# Patient Record
Sex: Male | Born: 1955
Health system: Southern US, Community
[De-identification: ages and names within clinical notes are randomized; demographics above are authoritative.]

## PROBLEM LIST (undated history)

## (undated) DIAGNOSIS — E785 Hyperlipidemia, unspecified: Secondary | ICD-10-CM

## (undated) DIAGNOSIS — B1910 Unspecified viral hepatitis B without hepatic coma: Secondary | ICD-10-CM

## (undated) DIAGNOSIS — Z5189 Encounter for other specified aftercare: Secondary | ICD-10-CM

## (undated) DIAGNOSIS — B1920 Unspecified viral hepatitis C without hepatic coma: Secondary | ICD-10-CM

## (undated) DIAGNOSIS — R011 Cardiac murmur, unspecified: Secondary | ICD-10-CM

## (undated) DIAGNOSIS — Z8619 Personal history of other infectious and parasitic diseases: Secondary | ICD-10-CM

## (undated) DIAGNOSIS — M1990 Unspecified osteoarthritis, unspecified site: Secondary | ICD-10-CM

## (undated) HISTORY — DX: Hyperlipidemia, unspecified: E78.5

## (undated) HISTORY — DX: Unspecified osteoarthritis, unspecified site: M19.90

## (undated) HISTORY — DX: Encounter for other specified aftercare: Z51.89

## (undated) HISTORY — DX: Unspecified viral hepatitis B without hepatic coma: B19.10

## (undated) HISTORY — DX: Cardiac murmur, unspecified: R01.1

## (undated) HISTORY — DX: Personal history of other infectious and parasitic diseases: Z86.19

## (undated) HISTORY — DX: Unspecified viral hepatitis C without hepatic coma: B19.20

## (undated) HISTORY — PX: EYE SURGERY: SHX253

---

## 1979-06-19 HISTORY — PX: KIDNEY SURGERY: SHX687

## 1997-06-18 HISTORY — PX: SHOULDER SURGERY: SHX246

## 2000-06-18 HISTORY — PX: OTHER SURGICAL HISTORY: SHX169

## 2010-06-18 HISTORY — PX: JOINT REPLACEMENT: SHX530

## 2015-03-18 LAB — PSA: PSA: 0.8

## 2017-04-18 LAB — CBC AND DIFFERENTIAL
HCT: 48 (ref 41–53)
Hemoglobin: 14.7 (ref 13.5–17.5)
Neutrophils Absolute: 3
Platelets: 324 (ref 150–399)
WBC: 5.3

## 2017-04-18 LAB — LIPID PANEL
Cholesterol: 268 — AB (ref 0–200)
HDL: 47 (ref 35–70)
LDL Cholesterol: 187
Triglycerides: 171 — AB (ref 40–160)

## 2017-04-18 LAB — HEPATIC FUNCTION PANEL
ALT: 21 (ref 10–40)
AST: 28 (ref 14–40)
Alkaline Phosphatase: 56 (ref 25–125)
Bilirubin, Total: 0.6

## 2017-04-18 LAB — BASIC METABOLIC PANEL
BUN: 12 (ref 4–21)
Glucose: 94
Potassium: 4.5 (ref 3.4–5.3)
Sodium: 140 (ref 137–147)

## 2017-04-18 LAB — CBC: RBC: 5.48 — AB (ref 3.87–5.11)

## 2017-05-16 LAB — HM COLONOSCOPY

## 2019-05-25 ENCOUNTER — Encounter: Payer: Self-pay | Admitting: Physician Assistant

## 2019-05-25 ENCOUNTER — Ambulatory Visit (INDEPENDENT_AMBULATORY_CARE_PROVIDER_SITE_OTHER): Payer: Commercial Managed Care - PPO | Admitting: Physician Assistant

## 2019-05-25 ENCOUNTER — Other Ambulatory Visit: Payer: Self-pay

## 2019-05-25 VITALS — BP 140/86 | HR 56 | Temp 97.5°F | Ht 70.0 in | Wt 181.5 lb

## 2019-05-25 DIAGNOSIS — M5431 Sciatica, right side: Secondary | ICD-10-CM | POA: Diagnosis not present

## 2019-05-25 DIAGNOSIS — R2232 Localized swelling, mass and lump, left upper limb: Secondary | ICD-10-CM | POA: Diagnosis not present

## 2019-05-25 DIAGNOSIS — M25512 Pain in left shoulder: Secondary | ICD-10-CM

## 2019-05-25 DIAGNOSIS — M79644 Pain in right finger(s): Secondary | ICD-10-CM

## 2019-05-25 DIAGNOSIS — G8929 Other chronic pain: Secondary | ICD-10-CM

## 2019-05-25 MED ORDER — PREDNISONE 5 MG PO TABS: ORAL_TABLET | ORAL | 0 refills | Status: DC

## 2019-05-25 NOTE — Patient Instructions (Signed)
It was great to see you!  Please make and appointment with Dr. Georgina Snell on your way out.  Let's try the prednisone to help with inflammation.  Let's follow-up in 3 months for a physical, sooner if you have concerns.  Take care,  [name] PA-C

## 2019-05-25 NOTE — Progress Notes (Signed)
Nathaniel Stevens is a 63 y.o. male here to Establish care.  I acted as a Education administrator for [employer], PA-C Anselmo Pickler, LPN  History of Present Illness:   Chief Complaint  Patient presents with  . Sciatica    Right  . Trigger thumb    Right  . Shoulder Pain    Left   Sciatica Pt c/o gluteal pain radiating down entire Rt leg off and on x 6 weeks. Also having numbness in toes of right foot. Much worse first thing in the morning.  Has looked up exercises and done these at home and it has helped a bit, was 10/10 but is now more bearable. No recent back injury, but did move [place] multiple [place] 6 months ago and did lots of heavy lifting with his move. No issues with bowel or bladder incontinence. Takes occasional motrin -- 1 to 2 times.   Shoulder pain Active swimmer in the past, prior to pandemic. Changed to free weight program after pandemic, about 6 months ago.[name] He noticed that his pain was worse with using the free weights, so he actually recently purchased a total body gym and feels like this has significant helped with his symptoms. He does report a history of frozen shoulder, in 2018, and feels like this may be the same thing? Denies hx DM.  Bony lesion Growth calcium deposit to L anterior shoulder. Imaged 3 years ago and was told it was a calcification. Has gotten slightly bigger and wants to know if there is anything specific that can be done about it.  Trigger thumb Pt c/o pain right thumb pain  x 18 months, has been doing thumb splinting and Motrin no relief. R- handed. Pain was 6/10. He started only splinting at night and also immobilizing his thumb joint and found that to be helpful for his symptoms. Denies weakness or known injury. Denies using fine instruments with his hands on a daily basis.  Weight -- Weight: 181 lb 8 oz (82.3 kg)    Depression screen [facility] 2/9 05/25/2019  Decreased Interest 0  Down, Depressed, Hopeless 0  PHQ - 2 Score 0    No flowsheet data  found.   Other providers/specialists: Patient Care Team: [name], [place] as PCP - General (Physician Assistant)   Past Medical History:  Diagnosis Date  . Hepatitis B    due to transfusion  . Hepatitis C    due to transfusion  . History of chicken pox      Social History   Socioeconomic History  . Marital status: Married    Spouse name: Not on file  . Number of children: Not on file  . Years of education: Not on file  . Highest education level: Not on file  Occupational History  . Not on file  Social Needs  . Financial resource strain: Not on file  . Food insecurity    Worry: Not on file    Inability: Not on file  . Transportation needs    Medical: Not on file    Non-medical: Not on file  Tobacco Use  . Smoking status: Former Smoker    Types: Cigarettes  . Smokeless tobacco: Never Used  . Tobacco comment: as a teen, has not smoked in 55 yrs  Substance and Sexual Activity  . Alcohol use: Yes    Comment: occasionally  . Drug use: Never  . Sexual activity: Yes  Lifestyle  . Physical activity    Days per week: Not on file  Minutes per session: Not on file  . Stress: Not on file  Relationships  . Social Herbalist on phone: Not on file    Gets together: Not on file    Attends religious service: Not on file    Active member of club or organization: Not on file    Attends meetings of clubs or organizations: Not on file    Relationship status: Not on file  . Intimate partner violence    Fear of current or ex partner: Not on file    Emotionally abused: Not on file    Physically abused: Not on file    Forced sexual activity: Not on file  Other Topics Concern  . Not on file  Social History Narrative  . Not on file    History reviewed. No pertinent surgical history.  Family History  Problem Relation Age of Onset  . Hypertension Father   . Hyperlipidemia Father   . Heart attack Sister   . Dementia Maternal Grandmother   . Cancer Neg Hx      No Known Allergies   Current Medications:   Current Outpatient Medications:  .  ibuprofen (ADVIL) 200 MG tablet, Take 200 mg by mouth as needed., Disp: , Rfl:  .  Melatonin 5 MG CAPS, Take 1 capsule by mouth at bedtime as needed., Disp: , Rfl:  .  predniSONE (DELTASONE) 5 MG tablet, 6-5-4-3-2-1-off, Disp: 21 tablet, Rfl: 0   Review of Systems:   ROS Negative unless otherwise specified per HPI.  Vitals:   Vitals:   05/25/19 1438  BP: 140/86  Pulse: (!) 56  Temp: (!) 97.5 F (36.4 C)  TempSrc: Temporal  SpO2: 98%  Weight: 181 lb 8 oz (82.3 kg)  Height: 5\' 10"  (1.778 m)      Body mass index is 26.04 kg/m.  Physical Exam:   Physical Exam Vitals signs and nursing note reviewed.  Constitutional:      General: He is not in acute distress.    Appearance: He is well-developed. He is not ill-appearing or toxic-appearing.  HENT:     Head: Normocephalic and atraumatic.  Cardiovascular:     Rate and Rhythm: Regular rhythm. Bradycardia present.     Heart sounds: Normal heart sounds.  Pulmonary:     Effort: Pulmonary effort is normal. No accessory muscle usage or respiratory distress.     Breath sounds: Normal breath sounds.  Musculoskeletal:     Comments: Shoulder exam: Bony fixed nodule to anterior L shoulder No pain with palpation of shoulder landmarks. FROM in abduction and forward flexion. No pain or weakness with testing SITS in ext/int rotation.  Back exam: No decreased ROM 2/2 pain with flexion/extension, lateral side bends, or rotation. Reproducible tenderness with deep palpation to bilateral paraspinal muscles. No bony tenderness. No evidence of erythema, rash or ecchymosis. Negative STLR bilaterally.  Hand exam: Pain with slight tenderness to MCP joint. Slight decreased ROM due to pain of joint.    Skin:    General: Skin is warm and dry.  Neurological:     Mental Status: He is alert.     Comments: Normal perceived sensation to R hand  Psychiatric:         Speech: Speech normal.        Behavior: Behavior is cooperative.     No results found for this or any previous visit.  Assessment and Plan:   Sahal was seen today for sciatica, trigger thumb and shoulder pain.  Diagnoses and all orders for this visit:  Acute pain of left shoulder No red flags on discussion. Trial oral prednisone. Referral to sports medicine. -     Ambulatory referral to Sports Medicine  Right sciatic nerve pain No red flags on discussion. Trial oral prednisone. Referral to sports medicine.  Chronic pain of right thumb Suspect possible early arthritis. Continue night splinting. Trial oral prednisone. Referral to sports medicine.  Nodule of skin of left upper extremity Calcified nodule, will ask sports med to look at with u/s if they are agreeable.  Other orders -     predniSONE (DELTASONE) 5 MG tablet; 6-5-4-3-2-1-off  . Reviewed expectations re: course of current medical issues. . Discussed self-management of symptoms. . Outlined signs and symptoms indicating need for more acute intervention. . Patient verbalized understanding and all questions were answered. . See orders for this visit as documented in the electronic medical record. . Patient received an After-Visit Summary.  CMA or LPN served as scribe during this visit. History, Physical, and Plan performed by medical provider. The above documentation has been reviewed and is accurate and complete.  I spent 45 minutes with this patient, greater than 50% was face-to-face time counseling regarding the above diagnoses.  [name], PA-C

## 2019-05-27 ENCOUNTER — Other Ambulatory Visit: Payer: Self-pay

## 2019-05-28 ENCOUNTER — Ambulatory Visit: Payer: Commercial Managed Care - PPO | Admitting: Family Medicine

## 2019-05-28 ENCOUNTER — Encounter: Payer: Self-pay | Admitting: Physician Assistant

## 2019-05-28 ENCOUNTER — Other Ambulatory Visit: Payer: Commercial Managed Care - PPO

## 2019-05-28 ENCOUNTER — Ambulatory Visit (INDEPENDENT_AMBULATORY_CARE_PROVIDER_SITE_OTHER): Payer: Commercial Managed Care - PPO | Admitting: Family Medicine

## 2019-05-28 ENCOUNTER — Encounter: Payer: Self-pay | Admitting: Family Medicine

## 2019-05-28 ENCOUNTER — Ambulatory Visit (INDEPENDENT_AMBULATORY_CARE_PROVIDER_SITE_OTHER): Payer: Commercial Managed Care - PPO

## 2019-05-28 ENCOUNTER — Other Ambulatory Visit: Payer: Self-pay

## 2019-05-28 VITALS — BP 158/78 | HR 58 | Ht 70.0 in | Wt 181.8 lb

## 2019-05-28 DIAGNOSIS — M65311 Trigger thumb, right thumb: Secondary | ICD-10-CM

## 2019-05-28 DIAGNOSIS — M79644 Pain in right finger(s): Secondary | ICD-10-CM

## 2019-05-28 DIAGNOSIS — M25512 Pain in left shoulder: Secondary | ICD-10-CM

## 2019-05-28 DIAGNOSIS — M5417 Radiculopathy, lumbosacral region: Secondary | ICD-10-CM

## 2019-05-28 NOTE — Progress Notes (Signed)
Subjective:    I'm seeing this patient as a consultation for:  Len Blalock, [place].  Note will be routed back to referring provider.  CC: L shoulder and R thumb and R sciatica  I, Molly Weber, LAT, ATC, am serving as scribe for Dr. Lynne Leader.  HPI: Pt is a 63 y/o male presenting w/ c/o R-sided sciatica, L shoulder and R thumb pain.  R sciatica:  Pt reports R post thigh pain that radiates into the R lower leg w/ numbness and tingling into the R foot (lateral foot, ball of foot and toes 2-5).  He notes that these symptoms started about 2.5-3 months ago.  The only contributing factor that he can note was packing and moving in June 2020. Aggravating factors include not exercising.  He has a history of similar symptoms on the L LE from his 28s which were alleviated from a HEP prescribed by a PT.  He has been doing a HEP most recently that he found on-line.  He has not had any recent lumbar x-rays.   L shoulder:  Pain x 6 months approximately after beginning lifting w/ free weights.  History of adhesive capsulitis in 2018.  Pt has full L shoulder ROM w/ only slight pain at end-range aBd.  R thumb pain: Insidious onset pain x 2 months located at the base of the R thumb.  Rates pain as a 6/10 that will radiate into the R radial thumb w/ thumb aDd.    Has tried hand/thumb splint and Motrin.  He also reports prior triggering in the R thumb which has subsided.  He con't to use his splint at night.    Past medical history, Surgical history, Family history not pertinant except as noted below, Social history, Allergies, and medications have been entered into the medical record, reviewed, and no changes needed.   Review of Systems: No headache, visual changes, nausea, vomiting, diarrhea, constipation, dizziness, abdominal pain, skin rash, fevers, chills, night sweats, weight loss, swollen lymph nodes, body aches, joint swelling, muscle aches, chest pain, shortness of breath, mood changes, visual or auditory  hallucinations.   Objective:    Vitals:   05/28/19 0922  BP: (!) 158/78  Pulse: (!) 58  SpO2: 97%   General: Well Developed, well nourished, and in no acute distress.  Neuro/Psych: Alert and oriented x3, extra-ocular muscles intact, able to move all 4 extremities, sensation grossly intact. Skin: Warm and dry, no rashes noted.  Respiratory: Not using accessory muscles, speaking in full sentences, trachea midline.  Cardiovascular: Pulses palpable, no extremity edema. Abdomen: Does not appear distended. MSK:  Left shoulder: Slightly swollen and AC joint otherwise normal-appearing Mildly tender palpation AC joint Range of motion full abduction external and internal rotation. Strength intact abduction external/internal rotation. Minimally positive Hawkins and Neer's test. Significantly positive crossover arm compression test. Negative empty can test. Negative Yergason's and speeds test.  Pulses cap refill and sensation intact bilateral extremities.  Right hand: Enlarged first MCP.  Mildly enlarged PIP and DIP 2,3, 4, and 5. Normal thumb motion. Mildly tender palpation at palmar thumb at MCP with palpable nodule. No current triggering with thumb motion. Stable ligament exam to thumb at MCP.  L-spine: Nontender to spinal midline. Normal lumbar motion. Lower extremity strength reflexes and sensation are equal and intact bilateral extremities. Positive right-sided slump test. Normal gait.  Lab and Radiology Results X-ray images L-spine, right hand, left shoulder ordered today and will be completed at outside location in the near future.  Impression and Recommendations:    Assessment and Plan: 63 y.o. male with multiple pain complaints  Dominant issue is right-sided lumbosacral radiculopathy at S1 and possibly including L5 nerve root.  Symptoms ongoing for 2-1/2 to 3 months.  Fortunately not experiencing weakness or significant numbness.  Patient is already completing some  conservative management including exercises on his own as well as course of prednisone.  We will continue prednisone course and progress with home exercise program.  Discussed gabapentin however patient is a Human resources officer for [employer] and I am not sure the gabapentin is completely compatible with his job.  He will do some reading on his own and if compatible I will be happy to prescribe. Will obtain x-ray L-spine today likely followed by MRI for epidural steroid injection planning.  Additionally patient has left shoulder pain.  Dominant finding on exam is AC joint swelling.  Patient has a component of AC DJD as well as mild rotator cuff tendinopathy.  Plan to proceed with home exercise program and diclofenac gel trial.  Additionally will obtain x-ray today.  Likely will consider physical therapy in the future if not improving.  Right thumb pain.  Patient has improving trigger thumb however also has changes in his hand consistent with DJD.  X-ray is pending which should help further evaluate this.  We will treat with double Band-Aid splint for trigger thumb as well as topical diclofenac gel.  Recheck near future if needed if not improving. .   Orders Placed This Encounter  Procedures  . DG Lumbar Spine Complete    Standing Status:   Future    Standing Expiration Date:   07/28/2020    Order Specific Question:   Reason for Exam (SYMPTOM  OR DIAGNOSIS REQUIRED)    Answer:   eval right leg radiculopathy L5 S1    Order Specific Question:   Preferred imaging location?    Answer:   [place]    Order Specific Question:   Radiology Contrast Protocol - do NOT remove file path    Answer:   \\[email or web address]s.[email or web address]  . DG Hand Complete Right    Standing Status:   Future    Standing Expiration Date:   07/28/2020    Order Specific Question:   Reason for Exam (SYMPTOM  OR DIAGNOSIS REQUIRED)    Answer:   eval 1st MTP pain and swelling    Order Specific  Question:   Preferred imaging location?    Answer:   [place] [facility]    Order Specific Question:   Radiology Contrast Protocol - do NOT remove file path    Answer:   \\[email or web address]s.pdf  . DG Shoulder Left    Standing Status:   Future    Standing Expiration Date:   07/28/2020    Order Specific Question:   Reason for Exam (SYMPTOM  OR DIAGNOSIS REQUIRED)    Answer:   eval AC DJD and RTC tendonopathy    Order Specific Question:   Preferred imaging location?    Answer:   [place] [facility]    Order Specific Question:   Radiology Contrast Protocol - do NOT remove file path    Answer:   \\[email or web address]\[email or web address]s.[email or web address]   No orders of the defined types were placed in this encounter.   Discussed warning signs or symptoms. Please see discharge instructions. Patient expresses understanding.  The above documentation has been reviewed and is accurate and complete Lynne Leader

## 2019-05-28 NOTE — Patient Instructions (Addendum)
Thank you for coming in today.  Get xrays.  Use the over the counter voltaren gel on the hand and shoulder as needed up to 4x daily for pain.  Use the double bandaid splint on the right thumb for trigger thumb during activity and at bedtime.  Work on shoulder exercises and core exercises.   Anticipate MRI in the near future.   Follow up with me following MRI to review results and discuss next steps.   I will also consider Gabapentin for nerve pain. Research gabapentin with your licence.   We're moving!  Dr. Clovis Riley new office will be located at [address] on the 1st floor.  This location is across the street from the [employer] and in the same complex as the [facility] and [employer]  Our new office phone number will be [phone].  We anticipate beginning to see patients at the [facility] office in early December 2020.  Sciatica  Sciatica is pain, weakness, tingling, or loss of feeling (numbness) along the sciatic nerve. The sciatic nerve starts in the lower back and goes down the back of each leg. Sciatica usually goes away on its own or with treatment. Sometimes, sciatica may come back (recur). What are the causes? This condition happens when the sciatic nerve is pinched or has pressure put on it. This may be the result of:  A disk in between the bones of the spine bulging out too far (herniated disk).  Changes in the spinal disks that occur with aging.  A condition that affects a muscle in the butt.  Extra bone growth near the sciatic nerve.  A break (fracture) of the area between your hip bones (pelvis).  Pregnancy.  Tumor. This is rare. What increases the risk? You are more likely to develop this condition if you:  Play sports that put pressure or stress on the spine.  Have poor strength and ease of movement (flexibility).  Have had a back injury in the past.  Have had back surgery.  Sit for long periods of time.  Do  activities that involve bending or lifting over and over again.  Are very overweight (obese). What are the signs or symptoms? Symptoms can vary from mild to very bad. They may include:  Any of these problems in the lower back, leg, hip, or butt: ? Mild tingling, loss of feeling, or dull aches. ? Burning sensations. ? Sharp pains.  Loss of feeling in the back of the calf or the sole of the foot.  Leg weakness.  Very bad back pain that makes it hard to move. These symptoms may get worse when you cough, sneeze, or laugh. They may also get worse when you sit or stand for long periods of time. How is this treated? This condition often gets better without any treatment. However, treatment may include:  Changing or cutting back on physical activity when you have pain.  Doing exercises and stretching.  Putting ice or heat on the affected area.  Medicines that help: ? To relieve pain and swelling. ? To relax your muscles.  Shots (injections) of medicines that help to relieve pain, irritation, and swelling.  Surgery. Follow these instructions at home: Medicines  Take over-the-counter and prescription medicines only as told by your doctor.  Ask your doctor if the medicine prescribed to you: ? Requires you to avoid driving or using heavy machinery. ? Can cause trouble pooping (constipation). You may need to take these steps to prevent  or treat trouble pooping:  Drink enough fluids to keep your pee (urine) pale yellow.  Take over-the-counter or prescription medicines.  Eat foods that are high in fiber. These include beans, whole grains, and fresh fruits and vegetables.  Limit foods that are high in fat and sugar. These include fried or sweet foods. Managing pain      If told, put ice on the affected area. ? Put ice in a plastic bag. ? Place a towel between your skin and the bag. ? Leave the ice on for 20 minutes, 2-3 times a day.  If told, put heat on the affected area.  Use the heat source that your doctor tells you to use, such as a moist heat pack or a heating pad. ? Place a towel between your skin and the heat source. ? Leave the heat on for 20-30 minutes. ? Remove the heat if your skin turns bright red. This is very important if you are unable to feel pain, heat, or cold. You may have a greater risk of getting burned. Activity   Return to your normal activities as told by your doctor. Ask your doctor what activities are safe for you.  Avoid activities that make your symptoms worse.  Take short rests during the day. ? When you rest for a long time, do some physical activity or stretching between periods of rest. ? Avoid sitting for a long time without moving. Get up and move around at least one time each hour.  Exercise and stretch regularly, as told by your doctor.  Do not lift anything that is heavier than 10 lb (4.5 kg) while you have symptoms of sciatica. ? Avoid lifting heavy things even when you do not have symptoms. ? Avoid lifting heavy things over and over.  When you lift objects, always lift in a way that is safe for your body. To do this, you should: ? Bend your knees. ? Keep the object close to your body. ? Avoid twisting. General instructions  Stay at a healthy weight.  Wear comfortable shoes that support your feet. Avoid wearing high heels.  Avoid sleeping on a mattress that is too soft or too hard. You might have less pain if you sleep on a mattress that is firm enough to support your back.  Keep all follow-up visits as told by your doctor. This is important. Contact a doctor if:  You have pain that: ? Wakes you up when you are sleeping. ? Gets worse when you lie down. ? Is worse than the pain you have had in the past. ? Lasts longer than 4 weeks.  You lose weight without trying. Get help right away if:  You cannot control when you pee (urinate) or poop (have a bowel movement).  You have weakness in any of these areas  and it gets worse: ? Lower back. ? The area between your hip bones. ? Butt. ? Legs.  You have redness or swelling of your back.  You have a burning feeling when you pee. Summary  Sciatica is pain, weakness, tingling, or loss of feeling (numbness) along the sciatic nerve.  This condition happens when the sciatic nerve is pinched or has pressure put on it.  Sciatica can cause pain, tingling, or loss of feeling (numbness) in the lower back, legs, hips, and butt.  Treatment often includes rest, exercise, medicines, and putting ice or heat on the affected area. This information is not intended to replace advice given to you by your  health care provider. Make sure you discuss any questions you have with your health care provider. Document Released: 03/13/2008 Document Revised: 06/23/2018 Document Reviewed: 06/23/2018 Elsevier Patient Education  [place].  Please perform the exercise program that we have prepared for you and gone over in detail on a daily basis.  In addition to the handout you were provided you can access your program through: [email or web address]   Your unique program code is: : [email or web address] [id number]

## 2019-05-29 ENCOUNTER — Telehealth: Payer: Self-pay | Admitting: Family Medicine

## 2019-05-29 DIAGNOSIS — M5417 Radiculopathy, lumbosacral region: Secondary | ICD-10-CM

## 2019-05-29 NOTE — Telephone Encounter (Signed)
Called and LM for pt informing him that Dr. Georgina Snell placed a referral for a lumbar spine MRI at [facility].

## 2019-05-29 NOTE — Progress Notes (Signed)
Shoulder x-ray shows mild shoulder arthritis and evidence of old shoulder separation.

## 2019-05-29 NOTE — Progress Notes (Signed)
X-ray hand shows arthritis at the middle joint of the thumb where you have pain.

## 2019-05-29 NOTE — Telephone Encounter (Signed)
MRI lumbar spine ordered.

## 2019-05-29 NOTE — Progress Notes (Signed)
X-ray lumbar spine shows some scoliosis and arthritis.  This could potentially lead to a pinched nerve.  We will proceed with MRI for further evaluation.

## 2019-05-30 ENCOUNTER — Encounter: Payer: Self-pay | Admitting: Physician Assistant

## 2019-06-01 ENCOUNTER — Other Ambulatory Visit: Payer: Self-pay | Admitting: *Deleted

## 2019-06-01 NOTE — Telephone Encounter (Signed)
Called and spoke to pt.  He had received my message regarding the referral for his L-spine MRI but has not been contacted yet to schedule.  Advise pt that if he has not heard from [facility] regarding scheduling his MRI by the end of the week that he should contact us back and let us know.  Notes that his shoulder and thumb are feeling better and reports having finished his steroid dose pack today.

## 2019-06-02 ENCOUNTER — Telehealth: Payer: Self-pay | Admitting: Physical Therapy

## 2019-06-02 NOTE — Telephone Encounter (Signed)
Copied from [place] [id number]. Topic: Referral - Status >> Jun 02, 2019 11:22 AM Yvette Rack wrote: Reason for CRM: Pt stated he would like Dr. Georgina Snell to know that he would like to have the MRI done before January and he has not been contacted for an appt. Pt requests call back.

## 2019-06-02 NOTE — Telephone Encounter (Signed)
Please inform pt I am checking with the person doing the MRI authorization. We should know more soon. That should be the hold up.

## 2019-06-03 NOTE — Telephone Encounter (Signed)
Per Honor Loh, L-spine MRI authorization was obtained on 06/02/19 and [facility] contacted this morning so they can contact the pt to schedule his L-spine MRI.  Contacted pt and informed him of this information and let him know that he should anticipate a call from [place] sometime today to schedule his MRI.

## 2019-06-04 ENCOUNTER — Encounter: Payer: Self-pay | Admitting: Family Medicine

## 2019-06-07 ENCOUNTER — Ambulatory Visit (INDEPENDENT_AMBULATORY_CARE_PROVIDER_SITE_OTHER): Payer: Commercial Managed Care - PPO

## 2019-06-07 ENCOUNTER — Other Ambulatory Visit: Payer: Self-pay

## 2019-06-07 DIAGNOSIS — M5417 Radiculopathy, lumbosacral region: Secondary | ICD-10-CM

## 2019-06-08 ENCOUNTER — Encounter: Payer: Self-pay | Admitting: Family Medicine

## 2019-06-08 DIAGNOSIS — M5416 Radiculopathy, lumbar region: Secondary | ICD-10-CM

## 2019-06-08 NOTE — Progress Notes (Signed)
MRI lumbar spine shows nerve root impingement at S1.  Additionally shows mild left hydronephrosis which is where the outlet of the kidney gets somewhat obstructed. Treatment for bulging disc pressing on nerve root is epidural steroid injection like we discussed.  Would you like me to get that scheduled now?  Recommend follow-up back with [name] for discussion of kidney issue.  This seems to be a more minor issue.    Recommend recheck with me to go over these results in further detail

## 2019-06-17 ENCOUNTER — Other Ambulatory Visit: Payer: Self-pay

## 2019-06-17 ENCOUNTER — Ambulatory Visit
Admission: RE | Admit: 2019-06-17 | Discharge: 2019-06-17 | Disposition: A | Discharge status: Home / Self Care | Payer: Commercial Managed Care - PPO | Source: Ambulatory Visit | Attending: Family Medicine | Admitting: Family Medicine

## 2019-06-17 DIAGNOSIS — M5416 Radiculopathy, lumbar region: Secondary | ICD-10-CM

## 2019-06-17 MED ORDER — IOPAMIDOL (ISOVUE-M 200) INJECTION 41%
1.0000 mL | Freq: Once | INTRAMUSCULAR | Status: AC
  Administered 2019-06-17: 1 mL via EPIDURAL

## 2019-06-17 MED ORDER — METHYLPREDNISOLONE ACETATE 40 MG/ML INJ SUSP (RADIOLOG
120.0000 mg | Freq: Once | INTRAMUSCULAR | Status: AC
  Administered 2019-06-17: 120 mg via EPIDURAL

## 2019-06-17 NOTE — Discharge Instructions (Signed)

## 2019-06-22 ENCOUNTER — Encounter: Payer: Self-pay | Admitting: Physician Assistant

## 2019-09-13 ENCOUNTER — Encounter: Payer: Self-pay | Admitting: Family Medicine

## 2019-09-13 DIAGNOSIS — M5416 Radiculopathy, lumbar region: Secondary | ICD-10-CM

## 2019-09-24 ENCOUNTER — Ambulatory Visit: Payer: Commercial Managed Care - PPO | Admitting: Family Medicine

## 2019-09-28 ENCOUNTER — Other Ambulatory Visit: Payer: Self-pay | Admitting: Family Medicine

## 2019-09-28 ENCOUNTER — Other Ambulatory Visit: Payer: Self-pay

## 2019-09-28 ENCOUNTER — Ambulatory Visit
Admission: RE | Admit: 2019-09-28 | Discharge: 2019-09-28 | Disposition: A | Discharge status: Home / Self Care | Payer: Commercial Managed Care - PPO | Source: Ambulatory Visit | Attending: Family Medicine | Admitting: Family Medicine

## 2019-09-28 DIAGNOSIS — M5416 Radiculopathy, lumbar region: Secondary | ICD-10-CM

## 2019-09-28 MED ORDER — IOPAMIDOL (ISOVUE-M 200) INJECTION 41%
1.0000 mL | Freq: Once | INTRAMUSCULAR | Status: AC
  Administered 2019-09-28: 1 mL via EPIDURAL

## 2019-09-28 MED ORDER — METHYLPREDNISOLONE ACETATE 40 MG/ML INJ SUSP (RADIOLOG
120.0000 mg | Freq: Once | INTRAMUSCULAR | Status: AC
  Administered 2019-09-28: 120 mg via EPIDURAL

## 2019-09-28 NOTE — Discharge Instructions (Signed)

## 2020-03-25 ENCOUNTER — Encounter: Payer: Self-pay | Admitting: Physician Assistant

## 2020-04-04 ENCOUNTER — Other Ambulatory Visit: Payer: Self-pay

## 2020-04-04 ENCOUNTER — Encounter: Payer: Self-pay | Admitting: Physician Assistant

## 2020-04-04 ENCOUNTER — Ambulatory Visit (INDEPENDENT_AMBULATORY_CARE_PROVIDER_SITE_OTHER): Payer: Commercial Managed Care - PPO | Admitting: Physician Assistant

## 2020-04-04 VITALS — BP 116/80 | HR 56 | Temp 97.2°F | Ht 70.0 in | Wt 183.0 lb

## 2020-04-04 DIAGNOSIS — D229 Melanocytic nevi, unspecified: Secondary | ICD-10-CM | POA: Diagnosis not present

## 2020-04-04 DIAGNOSIS — L603 Nail dystrophy: Secondary | ICD-10-CM

## 2020-04-04 DIAGNOSIS — Z13220 Encounter for screening for lipoid disorders: Secondary | ICD-10-CM

## 2020-04-04 DIAGNOSIS — E663 Overweight: Secondary | ICD-10-CM

## 2020-04-04 DIAGNOSIS — Z136 Encounter for screening for cardiovascular disorders: Secondary | ICD-10-CM

## 2020-04-04 DIAGNOSIS — Z1159 Encounter for screening for other viral diseases: Secondary | ICD-10-CM

## 2020-04-04 DIAGNOSIS — L729 Follicular cyst of the skin and subcutaneous tissue, unspecified: Secondary | ICD-10-CM

## 2020-04-04 DIAGNOSIS — Z23 Encounter for immunization: Secondary | ICD-10-CM

## 2020-04-04 DIAGNOSIS — Z0001 Encounter for general adult medical examination with abnormal findings: Secondary | ICD-10-CM

## 2020-04-04 DIAGNOSIS — Z114 Encounter for screening for human immunodeficiency virus [HIV]: Secondary | ICD-10-CM

## 2020-04-04 DIAGNOSIS — Z125 Encounter for screening for malignant neoplasm of prostate: Secondary | ICD-10-CM

## 2020-04-04 NOTE — Progress Notes (Signed)
I acted as a Education administrator for [employer], PA-C Anselmo Pickler, LPN   Subjective:    Nathaniel Stevens is a 64 y.o. male and is here for a comprehensive physical exam.   HPI  Health Maintenance Due  Topic Date Due  . Hepatitis C Screening  Never done  . INFLUENZA VACCINE  01/17/2020   Acute Concerns: Nevus on R posterior thigh -- patient has had birthmark forever here, but recently noticed that it is now a raised lesion.  Thinning nails -- for the past 5 years or so he has noticed that his nails have been thinning. Denies any pain, severe ridges or clubbing. Does follow a mostly vegan diet and doesn't take any oral supplements. Cyst on forehead -- reports a small cyst like area to mid scalp, has been there for at least 3 years. Denies pain. Can sometimes get slightly red. Has not drained.  Chronic Issues: None  Health Maintenance: Immunizations -- UTD, will give Flu vaccine Colonoscopy -- due 2023, last one done at age 29 found one polyp. PSA -- done 02/2015 Diet -- mostly vegan Sleep habits -- normally good, can fluctuate with stress from work Exercise -- exercises almost daily; enjoys swimming but hasn't gotten back into this since COVID Weight -- Weight: 183 lb (83 kg)  Recent weight history Wt Readings from Last 10 Encounters:  04/04/20 183 lb (83 kg)  05/28/19 181 lb 12.8 oz (82.5 kg)  05/25/19 181 lb 8 oz (82.3 kg)   Body mass index is 26.26 kg/m. Mood -- overall good Alcohol use -- no concerns Tobacco use -- none  Depression screen PHQ 2/9 04/04/2020  Decreased Interest 0  Down, Depressed, Hopeless 0  PHQ - 2 Score 0    Other providers/specialists: Patient Care Team: [name], [place] as PCP - General (Physician Assistant)    Prostate Symptoms Questionnaire: Denies concerns.  UTD with dentist and eye doctor  PMHx, SurgHx, SocialHx, Medications, and Allergies were reviewed in the Visit Navigator and updated as appropriate.   Past Medical History:    Diagnosis Date  . Blood transfusion without reported diagnosis   . Hepatitis B    due to transfusion  . Hepatitis C    due to transfusion  . History of chicken pox      Past Surgical History:  Procedure Laterality Date  . JOINT REPLACEMENT Right 2012   Rt Knee  . KIDNEY SURGERY Left 1981  . SHOULDER SURGERY Right 1999  . wrist and hand surgery Right 2002     Family History  Problem Relation Age of Onset  . Hypertension Father   . Hyperlipidemia Father   . Heart attack Sister   . Dementia Maternal Grandmother   . Cancer Neg Hx     Social History   Tobacco Use  . Smoking status: Former Smoker    Types: Cigarettes  . Smokeless tobacco: Never Used  . Tobacco comment: as a teen, has not smoked in 49 yrs  Vaping Use  . Vaping Use: Never used  Substance Use Topics  . Alcohol use: Yes    Comment: occasionally  . Drug use: Never    Review of Systems:   Review of Systems  Constitutional: Negative for chills, fever, malaise/fatigue and weight loss.  HENT: Negative for hearing loss, sinus pain and sore throat.   Eyes: Negative for blurred vision.  Respiratory: Negative for cough and shortness of breath.   Cardiovascular: Negative for chest pain, palpitations and leg swelling.  Gastrointestinal:  Negative for abdominal pain, constipation, diarrhea, heartburn, nausea and vomiting.  Genitourinary: Negative for dysuria, frequency and urgency.  Musculoskeletal: Negative for back pain, myalgias and neck pain.  Skin: Negative for itching and rash.  Neurological: Negative for dizziness, tingling, seizures, loss of consciousness and headaches.  Endo/Heme/Allergies: Negative for polydipsia.  Psychiatric/Behavioral: Negative for depression. The patient is not nervous/anxious.   All other systems reviewed and are negative.   Objective:    Vitals:   04/04/20 0811  BP: 116/80  Pulse: (!) 56  Temp: (!) 97.2 F (36.2 C)  SpO2: 96%   Body mass index is 26.26  kg/m.  General  Alert, cooperative, no distress, appears stated age  Head:  Normocephalic, without obvious abnormality, atraumatic  Eyes:  PERRL, conjunctiva/corneas clear, EOM's intact, fundi benign, both eyes       Ears:  Normal TM's and external ear canals, both ears  Nose: Nares normal, septum midline, mucosa normal, no drainage or sinus tenderness  Throat: Lips, mucosa, and tongue normal; teeth and gums normal  Neck: Supple, symmetrical, trachea midline, no adenopathy;     thyroid:  No enlargement/tenderness/nodules; no carotid bruit or JVD  Back:   Symmetric, no curvature, ROM normal, no CVA tenderness  Lungs:   Clear to auscultation bilaterally, respirations unlabored  Chest wall:  No tenderness or deformity  Heart:  Regular rate and rhythm, S1 and S2 normal, no murmur, rub or gallop  Abdomen:   Soft, non-tender, bowel sounds active all four quadrants, no masses, no organomegaly  Extremities: Extremities normal, atraumatic, no cyanosis or edema  Prostate : Not done.   Skin: Skin color, texture, turgor normal, no rashes   Posterior R thigh with raised nevus approximately the size of a quarter  Small cyst-like lesion with slight fluctuance to middle forehead at hair line; no TTP or active drainage; flesh colored  Lymph nodes: Cervical, supraclavicular, and axillary nodes normal  Neurologic: CNII-XII grossly intact. Normal strength, sensation and reflexes throughout    AssessmentPlan:   Nathaniel Stevens was seen today for annual exam.  Diagnoses and all orders for this visit:  Encounter for general adult medical examination with abnormal findings Today patient counseled on age appropriate routine health concerns for screening and prevention, each reviewed and up to date or declined. Immunizations reviewed and up to date or declined. Labs ordered and reviewed. Risk factors for depression reviewed and negative. Hearing function and visual acuity are intact. ADLs screened and addressed as  needed. Functional ability and level of safety reviewed and appropriate. Education, counseling and referrals performed based on assessed risks today. Patient provided with a copy of personalized plan for preventive services.  Thin nails Update B12 and iron/ferritin levels. Recommend daily oral MVI with iron. Consider B12 supplementation if labs indicated. -     Vitamin B12; Future -     Iron; Future -     Ferritin; Future  Atypical nevus Given size, refer to dermatology for further evaluation.  Overweight Encouraged regular diet and exercise. -     CBC with Differential/Platelet; Future -     Comprehensive metabolic panel; Future -     Lipid panel; Future  Skin cyst Suspect possible sebaceous cyst. Recommend that he follow-up with dermatology regarding this as well to confirm dx.  Screening for HIV (human immunodeficiency virus) -     HIV Antibody (routine testing w rflx); Future  Encounter for screening for other viral diseases -     Hepatitis C antibody; Future  Prostate cancer screening -  PSA; Future  Encounter for lipid screening for cardiovascular disease -     Lipid panel; Future   Well Adult Exam: Labs ordered: Yes. Patient counseling was done. See below for items discussed. Discussed the patient's BMI.  The BMI BMI is not in the acceptable range; BMI management plan is completed Follow up in one year. Colon Cancer: UTD  Patient Counseling: [x]   Nutrition: Stressed importance of moderation in sodium/caffeine intake, saturated fat and cholesterol, caloric balance, sufficient intake of fresh fruits, vegetables, and fiber  [x]   Stressed the importance of regular exercise.   []   Substance Abuse: Discussed cessation/primary prevention of tobacco, alcohol, or other drug use; driving or other dangerous activities under the influence; availability of treatment for abuse.   [x]   Injury prevention: Discussed safety belts, safety helmets, smoke detector, smoking near bedding  or upholstery.   []   Sexuality: Discussed sexually transmitted diseases, partner selection, use of condoms, avoidance of unintended pregnancy  and contraceptive alternatives.   [x]   Dental health: Discussed importance of regular tooth brushing, flossing, and dental visits.  [x]   Health maintenance and immunizations reviewed. Please refer to Health maintenance section.   CMA or LPN served as scribe during this visit. History, Physical, and Plan performed by medical provider. The above documentation has been reviewed and is accurate and complete.   [name], PA-C [place]

## 2020-04-04 NOTE — Patient Instructions (Signed)
It was great to see you!  You will be contacted about your referral to the dermatologist. Will check your iron and B12 levels -- I will provide specific recommendations once you labs have returned.  Please go to the lab for blood work.   Our office will call you with your results unless you have chosen to receive results via MyChart.  If your blood work is normal we will follow-up each year for physicals and as scheduled for chronic medical problems.  If anything is abnormal we will treat accordingly and get you in for a follow-up.  Take care,  [facility] Maintenance, Male Adopting a healthy lifestyle and getting preventive care are important in promoting health and wellness. Ask your health care provider about:  The right schedule for you to have regular tests and exams.  Things you can do on your own to prevent diseases and keep yourself healthy. What should I know about diet, weight, and exercise? Eat a healthy diet   Eat a diet that includes plenty of vegetables, fruits, low-fat dairy products, and lean protein.  Do not eat a lot of foods that are high in solid fats, added sugars, or sodium. Maintain a healthy weight Body mass index (BMI) is a measurement that can be used to identify possible weight problems. It estimates body fat based on height and weight. Your health care provider can help determine your BMI and help you achieve or maintain a healthy weight. Get regular exercise Get regular exercise. This is one of the most important things you can do for your health. Most adults should:  Exercise for at least 150 minutes each week. The exercise should increase your heart rate and make you sweat (moderate-intensity exercise).  Do strengthening exercises at least twice a week. This is in addition to the moderate-intensity exercise.  Spend less time sitting. Even light physical activity can be beneficial. Watch cholesterol and blood lipids Have your blood  tested for lipids and cholesterol at 63 years of age, then have this test every 5 years. You may need to have your cholesterol levels checked more often if:  Your lipid or cholesterol levels are high.  You are older than 63 years of age.  You are at high risk for heart disease. What should I know about cancer screening? Many types of cancers can be detected early and may often be prevented. Depending on your health history and family history, you may need to have cancer screening at various ages. This may include screening for:  Colorectal cancer.  Prostate cancer.  Skin cancer.  Lung cancer. What should I know about heart disease, diabetes, and high blood pressure? Blood pressure and heart disease  High blood pressure causes heart disease and increases the risk of stroke. This is more likely to develop in people who have high blood pressure readings, are of African descent, or are overweight.  Talk with your health care provider about your target blood pressure readings.  Have your blood pressure checked: ? Every 3-5 years if you are 9-43 years of age. ? Every year if you are 74 years old or older.  If you are between the ages of 39 and 79 and are a current or former smoker, ask your health care provider if you should have a one-time screening for abdominal aortic aneurysm (AAA). Diabetes Have regular diabetes screenings. This checks your fasting blood sugar level. Have the screening done:  Once every three years after age 46 if you  are at a normal weight and have a low risk for diabetes.  More often and at a younger age if you are overweight or have a high risk for diabetes. What should I know about preventing infection? Hepatitis B If you have a higher risk for hepatitis B, you should be screened for this virus. Talk with your health care provider to find out if you are at risk for hepatitis B infection. Hepatitis C Blood testing is recommended for:  Everyone born from  [birth year] through 1964.  Anyone with known risk factors for hepatitis C. Sexually transmitted infections (STIs)  You should be screened each year for STIs, including gonorrhea and chlamydia, if: ? You are sexually active and are younger than 63 years of age. ? You are older than 63 years of age and your health care provider tells you that you are at risk for this type of infection. ? Your sexual activity has changed since you were last screened, and you are at increased risk for chlamydia or gonorrhea. Ask your health care provider if you are at risk.  Ask your health care provider about whether you are at high risk for HIV. Your health care provider may recommend a prescription medicine to help prevent HIV infection. If you choose to take medicine to prevent HIV, you should first get tested for HIV. You should then be tested every 3 months for as long as you are taking the medicine. Follow these instructions at home: Lifestyle  Do not use any products that contain nicotine or tobacco, such as cigarettes, e-cigarettes, and chewing tobacco. If you need help quitting, ask your health care provider.  Do not use street drugs.  Do not share needles.  Ask your health care provider for help if you need support or information about quitting drugs. Alcohol use  Do not drink alcohol if your health care provider tells you not to drink.  If you drink alcohol: ? Limit how much you have to 0-2 drinks a day. ? Be aware of how much alcohol is in your drink. In the [place] one drink equals one 12 oz bottle of beer (355 mL), one 5 oz glass of wine (148 mL), or one 1 oz glass of hard liquor (44 mL). General instructions  Schedule regular health, dental, and eye exams.  Stay current with your vaccines.  Tell your health care provider if: ? You often feel depressed. ? You have ever been abused or do not feel safe at home. Summary  Adopting a healthy lifestyle and getting preventive care are important in  promoting health and wellness.  Follow your health care provider's instructions about healthy diet, exercising, and getting tested or screened for diseases.  Follow your health care provider's instructions on monitoring your cholesterol and blood pressure. This information is not intended to replace advice given to you by your health care provider. Make sure you discuss any questions you have with your health care provider. Document Revised: 05/28/2018 Document Reviewed: 05/28/2018 Elsevier Patient Education  2020 [employer].

## 2020-04-05 ENCOUNTER — Encounter: Payer: Self-pay | Admitting: Physician Assistant

## 2020-04-07 LAB — CBC WITH DIFFERENTIAL/PLATELET
Absolute Monocytes: 497 cells/uL (ref 200–950)
Basophils Absolute: 48 cells/uL (ref 0–200)
Basophils Relative: 1.1 %
Eosinophils Absolute: 180 cells/uL (ref 15–500)
Eosinophils Relative: 4.1 %
HCT: 44.4 % (ref 38.5–50.0)
Hemoglobin: 14 g/dL (ref 13.2–17.1)
Lymphs Abs: 1003 cells/uL (ref 850–3900)
MCH: 26.2 pg — ABNORMAL LOW (ref 27.0–33.0)
MCHC: 31.5 g/dL — ABNORMAL LOW (ref 32.0–36.0)
MCV: 83 fL (ref 80.0–100.0)
MPV: 9.4 fL (ref 7.5–12.5)
Monocytes Relative: 11.3 %
Neutro Abs: 2671 cells/uL (ref 1500–7800)
Neutrophils Relative %: 60.7 %
Platelets: 330 10*3/uL (ref 140–400)
RBC: 5.35 10*6/uL (ref 4.20–5.80)
RDW: 14.7 % (ref 11.0–15.0)
Total Lymphocyte: 22.8 %
WBC: 4.4 10*3/uL (ref 3.8–10.8)

## 2020-04-07 LAB — COMPREHENSIVE METABOLIC PANEL
AG Ratio: 1.6 (calc) (ref 1.0–2.5)
ALT: 11 U/L (ref 9–46)
AST: 20 U/L (ref 10–35)
Albumin: 4.2 g/dL (ref 3.6–5.1)
Alkaline phosphatase (APISO): 53 U/L (ref 35–144)
BUN: 9 mg/dL (ref 7–25)
CO2: 27 mmol/L (ref 20–32)
Calcium: 9.5 mg/dL (ref 8.6–10.3)
Chloride: 107 mmol/L (ref 98–110)
Creat: 0.91 mg/dL (ref 0.70–1.25)
Globulin: 2.7 g/dL (calc) (ref 1.9–3.7)
Glucose, Bld: 92 mg/dL (ref 65–99)
Potassium: 4.7 mmol/L (ref 3.5–5.3)
Sodium: 142 mmol/L (ref 135–146)
Total Bilirubin: 0.4 mg/dL (ref 0.2–1.2)
Total Protein: 6.9 g/dL (ref 6.1–8.1)

## 2020-04-07 LAB — HCV RNA,QUANTITATIVE REAL TIME PCR
HCV Quantitative Log: <1.18 Log IU/mL — AB
HCV RNA, PCR, QN: <15 IU/mL — AB

## 2020-04-07 LAB — LIPID PANEL
Cholesterol: 221 mg/dL — ABNORMAL HIGH (ref <200)
HDL: 52 mg/dL (ref >=40)
LDL Cholesterol (Calc): 142 mg/dL (calc) — ABNORMAL HIGH
Non-HDL Cholesterol (Calc): 169 mg/dL (calc) — ABNORMAL HIGH (ref <130)
Total CHOL/HDL Ratio: 4.3 (calc) (ref <5.0)
Triglycerides: 138 mg/dL (ref <150)

## 2020-04-07 LAB — HIV ANTIBODY (ROUTINE TESTING W REFLEX): HIV 1&2 Ab, 4th Generation: NONREACTIVE

## 2020-04-07 LAB — VITAMIN B12: Vitamin B-12: 306 pg/mL (ref 200–1100)

## 2020-04-07 LAB — IRON: Iron: 40 ug/dL — ABNORMAL LOW (ref 50–180)

## 2020-04-07 LAB — HEPATITIS C ANTIBODY
Hepatitis C Ab: REACTIVE — AB
SIGNAL TO CUT-OFF: 23.8 — ABNORMAL HIGH (ref <1.00)

## 2020-04-07 LAB — FERRITIN: Ferritin: 6 ng/mL — ABNORMAL LOW (ref 24–380)

## 2020-04-07 LAB — PSA: PSA: 0.85 ng/mL (ref <=4.0)

## 2020-07-28 ENCOUNTER — Encounter: Payer: Self-pay | Admitting: Physician Assistant

## 2020-07-28 ENCOUNTER — Other Ambulatory Visit: Payer: Self-pay | Admitting: Physician Assistant

## 2020-07-28 DIAGNOSIS — R768 Other specified abnormal immunological findings in serum: Secondary | ICD-10-CM

## 2020-08-02 ENCOUNTER — Telehealth: Payer: Self-pay

## 2020-08-02 DIAGNOSIS — R768 Other specified abnormal immunological findings in serum: Secondary | ICD-10-CM | POA: Insufficient documentation

## 2020-08-02 NOTE — Telephone Encounter (Signed)
[facility] Patient Teacher, English as a foreign language completed.    The patient is insured through [employer].   HEP-C medication will need a PA and once it is approved it will have to [facility].  We will continue to follow to see if copay assistance is needed.  Ileene Patrick, [facility] Patient [facility] for [facility]ious [facility] Phone: [phone] Fax:  [phone]

## 2020-08-03 ENCOUNTER — Encounter: Payer: Self-pay | Admitting: Internal Medicine

## 2020-08-03 ENCOUNTER — Ambulatory Visit (INDEPENDENT_AMBULATORY_CARE_PROVIDER_SITE_OTHER): Payer: Commercial Managed Care - PPO | Admitting: Internal Medicine

## 2020-08-03 ENCOUNTER — Other Ambulatory Visit: Payer: Self-pay

## 2020-08-03 DIAGNOSIS — R768 Other specified abnormal immunological findings in serum: Secondary | ICD-10-CM | POA: Diagnosis not present

## 2020-08-03 DIAGNOSIS — Z8619 Personal history of other infectious and parasitic diseases: Secondary | ICD-10-CM

## 2020-08-03 NOTE — Assessment & Plan Note (Signed)
It sounds like he developed hepatitis B but had spontaneous clearance of his infection.  I would suggest reviewing his records from [place] to be certain that this has been documented.  If not I will check hepatitis B surface antibody as an initial screen to see if he is immune.

## 2020-08-03 NOTE — Progress Notes (Signed)
[facility] for Infectious Disease  Reason for Consult: Hepatitis C antibody positive Referring Provider: Hall Busing, PA  Assessment: It appears that his previous 4-month course of interferon and ribavirin was sufficient to cure his hepatitis C infection.  Based on his oral history and recent undetectable viral load he has had sustained virologic remission.  He does not need any further testing or treatment for hepatitis C.  It sounds like he developed hepatitis B but had spontaneous clearance of his infection.  I would suggest reviewing his records from [facility] to be certain that this has been documented.  If not I will check hepatitis B surface antibody as an initial screen to see if he is immune.   Plan: 1. Further testing or treatment needed for hepatitis C 2. Recommend reviewing previous medical records to assure that he is immune to hepatitis B  Patient Active Problem List   Diagnosis Date Noted  . Hepatitis C antibody test positive 08/02/2020    Priority: High    Patient's Medications   No medications on file    HPI: Nathaniel Stevens is a 65 y.o. male who recently moved to [facility] and establish primary care.  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  He recalls that his surgeon told him at that time that he would require a blood transfusion "out of an abundance of caution".  Several years later he was found to have hepatitis B that he spontaneously cleared and active hepatitis C.  He recalls that his hepatitis C was diagnosed in 63.  He does not recall ever being told that he developed any complications related to his hepatitis C.  He began treatment for hepatitis C in 1999 in [place], [place].  He received interferon and ribavirin.  A 6 month course of therapy was planned but he could tolerate it for only 3 months.  He had severe mood swings and  anger.treatment after 3 months.  He was tested on multiple occasions over the next several years and was always told that his hepatitis C had been cured.  His doctors there told him that there was no need to continue testing for hepatitis C. After moving here he tested positive for hepatitis C antibody again referral here today.  HIV antibody testing was negative.  His liver enzymes are normal.  He tells me that [name], [place] did receive copies of his records from his clinic in [place].  Review of Systems: Review of Systems  Constitutional: Negative for fever and weight loss.  Gastrointestinal: Negative for abdominal pain, nausea and vomiting.      Past Medical History:  Diagnosis Date  . Blood transfusion without reported diagnosis   . Hepatitis B    due to transfusion  . Hepatitis C    due to transfusion  . History of chicken pox     Social History   Tobacco Use  . Smoking status: Former Smoker    Types: Cigarettes  . Smokeless tobacco: Never Used  . Tobacco comment: as a teen, has not smoked in 3 yrs  Vaping Use  . Vaping Use: Never used  Substance Use Topics  . Alcohol use: Yes    Comment: occasionally  . Drug use: Never    Family History  Problem Relation Age of Onset  . Hypertension Father   . Hyperlipidemia Father   . Heart attack  Sister   . Dementia Maternal Grandmother   . Cancer Neg Hx    No Known Allergies  OBJECTIVE: Vitals:   08/03/20 0908  BP: (!) 168/93  Pulse: (!) 52  Temp: (!) 97.5 F (36.4 C)  TempSrc: Oral  Weight: 192 lb 6.4 oz (87.3 kg)   Body mass index is 27.61 kg/m.   Physical Exam Constitutional:      Comments: He is in good spirits.  Cardiovascular:     Rate and Rhythm: Normal rate.  Pulmonary:     Effort: Pulmonary effort is normal.  Psychiatric:        Mood and Affect: Mood normal.     Microbiology: No results found for this or any previous visit (from the past 240 hour(s)).  Michel Bickers, MD [facility]  for Infectious [facility] [phone] pager   [phone] cell 08/03/2020, 9:21 AM

## 2020-08-03 NOTE — Assessment & Plan Note (Signed)
It appears that his previous 48-month course of interferon and ribavirin was sufficient to cure his hepatitis C infection.  Based on his oral history and recent undetectable viral load he has had sustained virologic remission.  He does not need any further testing or treatment for hepatitis C.

## 2020-09-01 ENCOUNTER — Other Ambulatory Visit: Payer: Self-pay

## 2020-09-01 ENCOUNTER — Ambulatory Visit (INDEPENDENT_AMBULATORY_CARE_PROVIDER_SITE_OTHER): Payer: Commercial Managed Care - PPO | Admitting: Physician Assistant

## 2020-09-01 ENCOUNTER — Encounter: Payer: Self-pay | Admitting: Physician Assistant

## 2020-09-01 DIAGNOSIS — D1779 Benign lipomatous neoplasm of other sites: Secondary | ICD-10-CM | POA: Diagnosis not present

## 2020-09-01 DIAGNOSIS — D485 Neoplasm of uncertain behavior of skin: Secondary | ICD-10-CM

## 2020-09-01 DIAGNOSIS — L820 Inflamed seborrheic keratosis: Secondary | ICD-10-CM | POA: Diagnosis not present

## 2020-09-01 DIAGNOSIS — Z1283 Encounter for screening for malignant neoplasm of skin: Secondary | ICD-10-CM | POA: Diagnosis not present

## 2020-09-01 NOTE — Patient Instructions (Signed)

## 2020-09-01 NOTE — Progress Notes (Signed)
   New Patient   Subjective  Nathaniel Stevens is a 65 y.o. male who presents for the following: Skin Problem (Right post thigh- changing & ? Cyst on mid front scalp).   The following portions of the chart were reviewed this encounter and updated as appropriate:  Tobacco  Allergies  Meds  Problems  Med Hx  Surg Hx  Fam Hx      Objective  Well appearing patient in no apparent distress; mood and affect are within normal limits.  A focused examination was performed including waist up and legs. Relevant physical exam findings are noted in the Assessment and Plan.  Objective  Mid Frontal Scalp, torso: Soft nonmobile nodule  Objective  waist up and legs: No atypical nevi No signs of non-mole skin cancer. Full body skin exam  Objective  Right Thigh - Posterior: Bichromic slightly crusted plaque.5.7 to surp. to mole.     Assessment & Plan  Lipoma of other specified sites Mid Frontal Scalp, torso  Ok to leave if stable. observe  Screening exam for skin cancer waist up and legs  Yearly skin check  Neoplasm of uncertain behavior of skin Right Thigh - Posterior  Skin / nail biopsy Type of biopsy: tangential   Informed consent: discussed and consent obtained   Timeout: patient name, date of birth, surgical site, and procedure verified   Procedure prep:  Patient was prepped and draped in usual sterile fashion (Non sterile) Prep type:  Chlorhexidine Anesthesia: the lesion was anesthetized in a standard fashion   Anesthetic:  1% lidocaine w/ epinephrine 1-100,000 local infiltration Instrument used: flexible razor blade   Outcome: patient tolerated procedure well   Post-procedure details: wound care instructions given    Specimen 1 - Surgical pathology Differential Diagnosis: r/o atypia and isk  Check Margins: No  I, Emari Demmer, PA-C, have reviewed all documentation for this visit. The documentation on 09/01/20 for the exam, diagnosis, procedures, and orders are  all accurate and complete.

## 2020-09-11 ENCOUNTER — Encounter: Payer: Self-pay | Admitting: Physician Assistant

## 2020-10-06 IMAGING — DX DG SHOULDER 2+V*L*
2 series · 2 of 2 positions shown · non-contrast
Comparison: None.

CLINICAL DATA: Acute pain

EXAM:
LEFT SHOULDER - 2+ VIEW

[shoulder internal rotation ap]
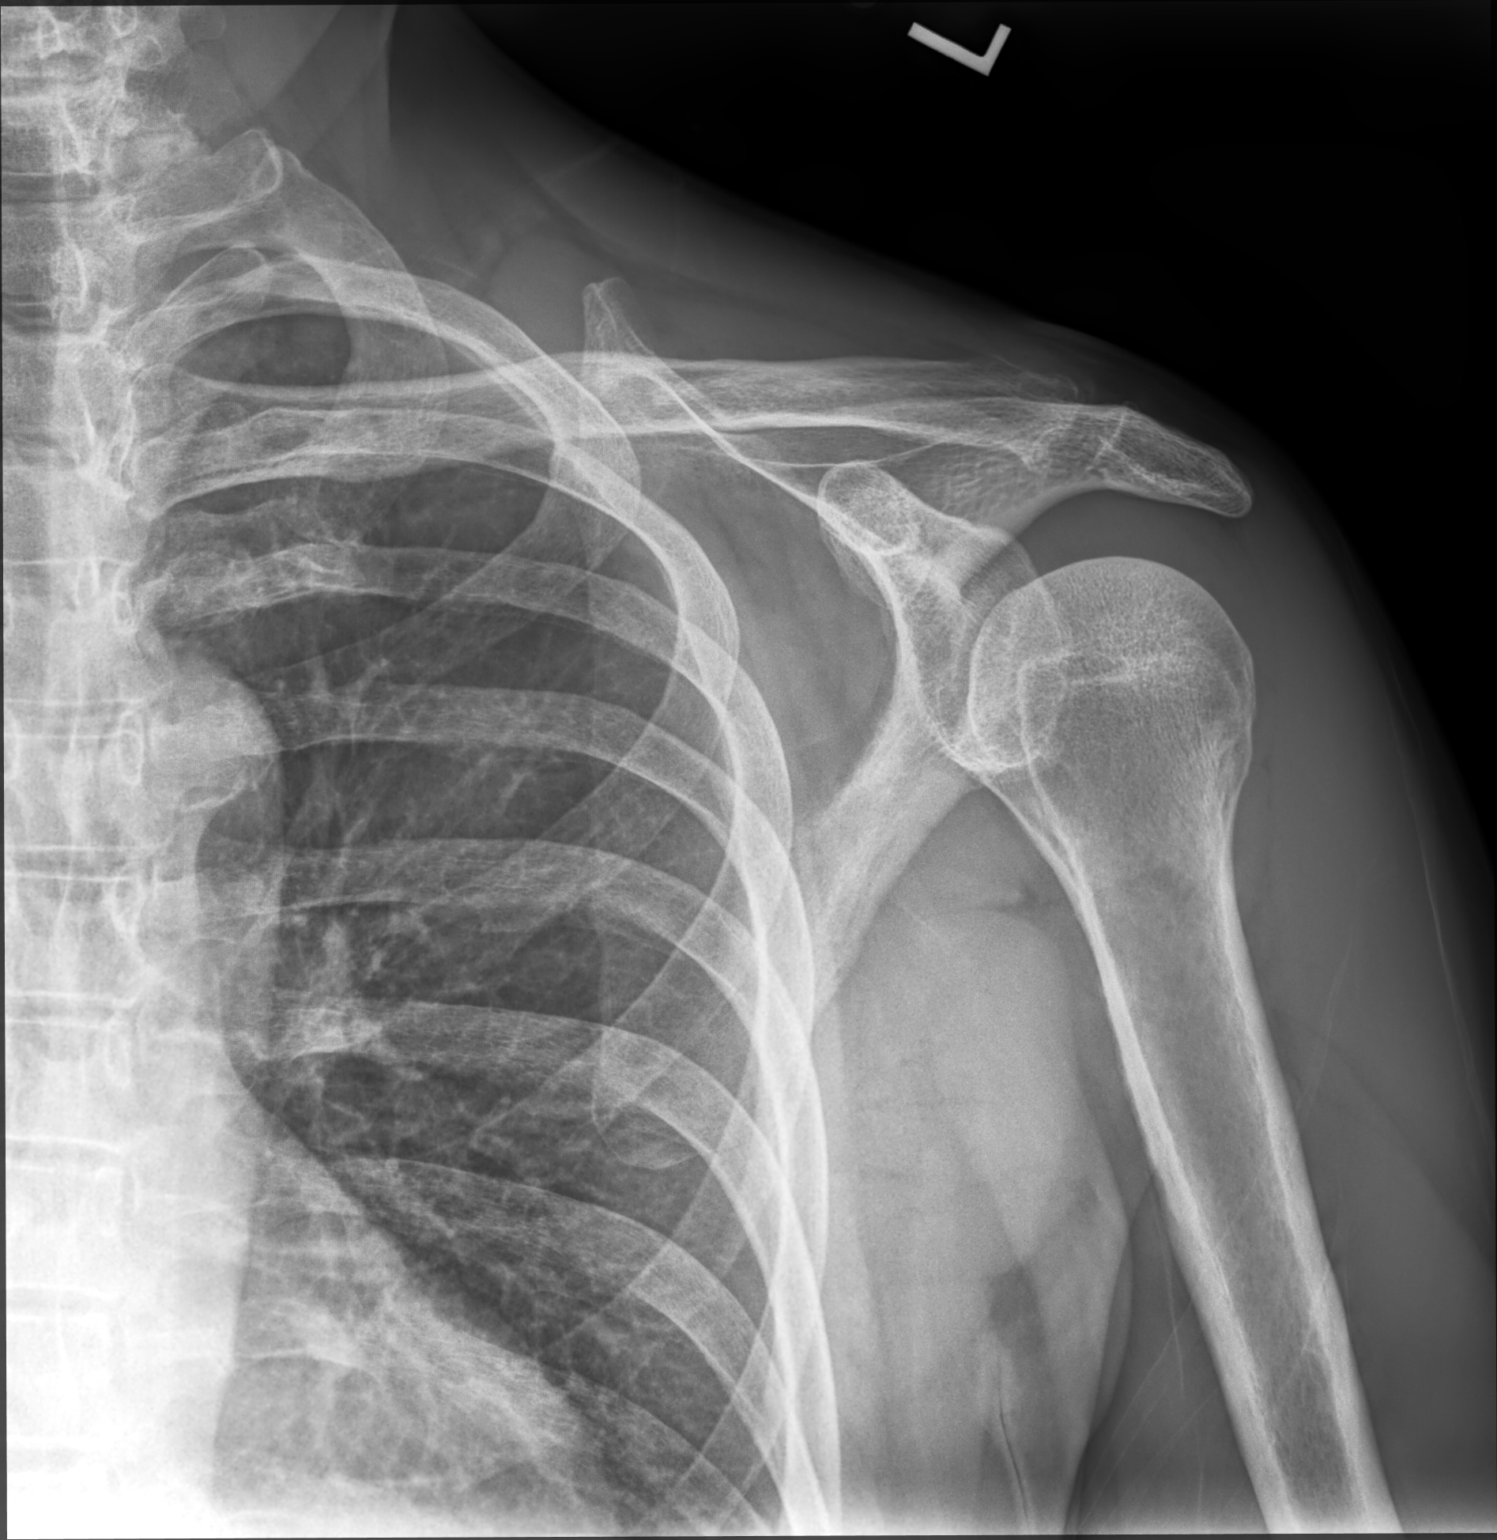

[shoulder (y view)]
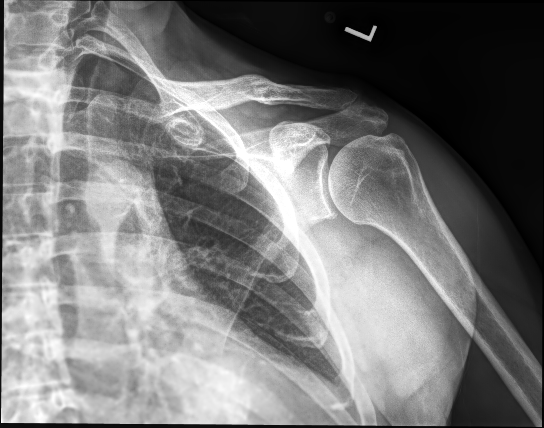

[2 of 2 positions shown; findings below may reference images not displayed]

FINDINGS: There is no evidence of fracture or dislocation. Glenohumeral joint
osteoarthritis is seen with joint space loss. There is mild widening
of the AC joint which measures 9 mm and transverse dimension. No
cord clavicular widening is seen. Soft tissues are unremarkable.
IMPRESSION: Mild glenohumeral joint

Slight widening of the AC joint which could be suggestive of grade 1
AC joint injury.

## 2020-12-26 ENCOUNTER — Encounter: Payer: Self-pay | Admitting: Physician Assistant

## 2021-05-01 NOTE — Progress Notes (Signed)
Subjective:    Nathaniel Stevens is a 65 y.o. male and is here for a comprehensive physical exam.  HPI  Health Maintenance Due  Topic Date Due   COVID-19 Vaccine (3 - Pfizer risk series) 04/29/2021    Acute Concerns: None discussed at this time.   Chronic Issues: None discussed at this time  Health Maintenance: Immunizations -- COVID- Last completed 04/01/21 Therapist, music- 2 doses and Moderna- 1 dose) Influenza- Last completed 04/01/21 Tdap- Last completed 05/24/14 Colonoscopy -- Last completed 04/04/17 PSA --  Lab Results  Component Value Date   PSA 0.85 04/04/2020   PSA 0.8 03/18/2015   Diet -- Partly vegan diet with occasional red meat. No high sugar drinks. Sleep habits -- Normal- 8 hours  Ophthalmology- UTD Dentistry- UTD Exercise -- Usually works out 6 days a week, 4 at the least; aerobic Weight -- Stable  Recent weight history Wt Readings from Last 10 Encounters:  05/02/21 187 lb 8 oz (85 kg)  08/03/20 192 lb 6.4 oz (87.3 kg)  04/04/20 183 lb (83 kg)  05/28/19 181 lb 12.8 oz (82.5 kg)  05/25/19 181 lb 8 oz (82.3 kg)   Body mass index is 26.9 kg/m. Mood -- Stable Alcohol use -- 1-2 glasses of wine per week Tobacco use -- Former Smoker Tobacco Use: Medium Risk   Smoking Tobacco Use: Former   Smokeless Tobacco Use: Never   Passive Exposure: Not on file     Depression screen PHQ 2/9 05/02/2021  Decreased Interest 0  Down, Depressed, Hopeless 0  PHQ - 2 Score 0    Other providers/specialists: Patient Care Team: [name], [place] as PCP - General (Physician Assistant)    PMHx, SurgHx, SocialHx, Medications, and Allergies were reviewed in the Visit Navigator and updated as appropriate.   Past Medical History:  Diagnosis Date   Arthritis 2014   Blood transfusion without reported diagnosis    Heart murmur 1958   Hepatitis B    due to transfusion   Hepatitis C    due to transfusion   History of chicken pox      Past Surgical History:   Procedure Laterality Date   EYE SURGERY  1999   JOINT REPLACEMENT Right 2012   Rt Knee   KIDNEY SURGERY Left 1981   SHOULDER SURGERY Right 1999   wrist and hand surgery Right 2002     Family History  Problem Relation Age of Onset   Hypertension Father    Hyperlipidemia Father    Heart attack Sister    Dementia Maternal Grandmother    Cancer Neg Hx     Social History   Tobacco Use   Smoking status: Former    Types: Cigarettes    Quit date: 08/03/1977    Years since quitting: 43.7   Smokeless tobacco: Never   Tobacco comments:    as a teen, has not smoked in 8 yrs  Vaping Use   Vaping Use: Never used  Substance Use Topics   Alcohol use: Yes    Alcohol/week: 4.0 standard drinks    Types: 4 Glasses of wine per week    Comment: occasionally   Drug use: Never    Review of Systems:   Review of Systems  Constitutional:  Negative for chills, fever, malaise/fatigue and weight loss.  HENT:  Negative for hearing loss, sinus pain and sore throat.   Respiratory:  Negative for cough and hemoptysis.   Cardiovascular:  Negative for chest pain, palpitations, leg swelling and  PND.  Gastrointestinal:  Negative for abdominal pain, constipation, diarrhea, heartburn, nausea and vomiting.  Genitourinary:  Negative for dysuria, frequency and urgency.  Musculoskeletal:  Negative for back pain, myalgias and neck pain.  Skin:  Negative for itching and rash.  Neurological:  Negative for dizziness, tingling, seizures and headaches.  Endo/Heme/Allergies:  Negative for polydipsia.  Psychiatric/Behavioral:  Negative for depression. The patient is not nervous/anxious.    Objective:    Vitals:   05/02/21 0904  BP: 120/76  Pulse: (!) 49  Temp: 97.7 F (36.5 C)  SpO2: 97%   Body mass index is 26.9 kg/m.  General  Alert, cooperative, no distress, appears stated age  Head:  Normocephalic, without obvious abnormality, atraumatic  Eyes:  PERRL, conjunctiva/corneas clear, EOM's intact,  fundi benign, both eyes       Ears:  Normal TM's and external ear canals, both ears  Nose: Nares normal, septum midline, mucosa normal, no drainage or sinus tenderness  Throat: Lips, mucosa, and tongue normal; teeth and gums normal  Neck: Supple, symmetrical, trachea midline, no adenopathy;     thyroid:  No enlargement/tenderness/nodules; no carotid bruit or JVD  Back:   Symmetric, no curvature, ROM normal, no CVA tenderness  Lungs:   Clear to auscultation bilaterally, respirations unlabored  Chest wall:  No tenderness or deformity  Heart:  Regular rate and rhythm, S1 and S2 normal, no murmur, rub or gallop  Abdomen:   Soft, non-tender, bowel sounds active all four quadrants, no masses, no organomegaly  Extremities: Extremities normal, atraumatic, no cyanosis or edema  Prostate : Deferred   Skin: Skin color, texture, turgor normal, no rashes or lesions  Lymph nodes: Cervical, supraclavicular, and axillary nodes normal  Neurologic: CNII-XII grossly intact. Normal strength, sensation and reflexes throughout   AssessmentPlan:   Routine physical examination Today patient counseled on age appropriate routine health concerns for screening and prevention, each reviewed and up to date or declined. Immunizations reviewed and up to date or declined. Labs ordered and reviewed. Risk factors for depression reviewed and negative. Hearing function and visual acuity are intact. ADLs screened and addressed as needed. Functional ability and level of safety reviewed and appropriate. Education, counseling and referrals performed based on assessed risks today. Patient provided with a copy of personalized plan for preventive services.  Prostate cancer screening Update PSA today and provide recommendations accordingly  Encounter for lipid screening for cardiovascular disease Update lipid panel today and provide recommendations accordingly  Overweight Patient has significant muscle mass, so I suspect that his  BMI is slightly inaccurate Continue healthy lifestyle efforts    Patient Counseling: [x]   Nutrition: Stressed importance of moderation in sodium/caffeine intake, saturated fat and cholesterol, caloric balance, sufficient intake of fresh fruits, vegetables, and fiber  [x]   Stressed the importance of regular exercise.   []   Substance Abuse: Discussed cessation/primary prevention of tobacco, alcohol, or other drug use; driving or other dangerous activities under the influence; availability of treatment for abuse.   [x]   Injury prevention: Discussed safety belts, safety helmets, smoke detector, smoking near bedding or upholstery.   []   Sexuality: Discussed sexually transmitted diseases, partner selection, use of condoms, avoidance of unintended pregnancy  and contraceptive alternatives.   [x]   Dental health: Discussed importance of regular tooth brushing, flossing, and dental visits.  [x]   Health maintenance and immunizations reviewed. Please refer to Health maintenance section.   I,Havlyn C Ratchford,acting as a scribe for [employer], PA.,have documented all relevant documentation on the behalf of  [name], PA,as directed by  [name], PA while in the presence of [place], [place].  I, [place], [place], have reviewed all documentation for this visit. The documentation on 05/02/21 for the exam, diagnosis, procedures, and orders are all accurate and complete.  [name], PA-C [place]

## 2021-05-02 ENCOUNTER — Other Ambulatory Visit: Payer: Self-pay

## 2021-05-02 ENCOUNTER — Encounter: Payer: Self-pay | Admitting: Physician Assistant

## 2021-05-02 ENCOUNTER — Ambulatory Visit (INDEPENDENT_AMBULATORY_CARE_PROVIDER_SITE_OTHER): Payer: Commercial Managed Care - PPO | Admitting: Physician Assistant

## 2021-05-02 VITALS — BP 120/76 | HR 49 | Temp 97.7°F | Ht 70.0 in | Wt 187.5 lb

## 2021-05-02 DIAGNOSIS — Z136 Encounter for screening for cardiovascular disorders: Secondary | ICD-10-CM

## 2021-05-02 DIAGNOSIS — Z125 Encounter for screening for malignant neoplasm of prostate: Secondary | ICD-10-CM

## 2021-05-02 DIAGNOSIS — E663 Overweight: Secondary | ICD-10-CM | POA: Diagnosis not present

## 2021-05-02 DIAGNOSIS — Z0000 Encounter for general adult medical examination without abnormal findings: Secondary | ICD-10-CM | POA: Diagnosis not present

## 2021-05-02 DIAGNOSIS — Z13220 Encounter for screening for lipoid disorders: Secondary | ICD-10-CM | POA: Diagnosis not present

## 2021-05-02 LAB — CBC WITH DIFFERENTIAL/PLATELET
Basophils Absolute: 0 10*3/uL (ref 0.0–0.1)
Basophils Relative: 1 % (ref 0.0–3.0)
Eosinophils Absolute: 0.2 10*3/uL (ref 0.0–0.7)
Eosinophils Relative: 4.1 % (ref 0.0–5.0)
HCT: 48.5 % (ref 39.0–52.0)
Hemoglobin: 16.1 g/dL (ref 13.0–17.0)
Lymphocytes Relative: 26.9 % (ref 12.0–46.0)
Lymphs Abs: 1.2 10*3/uL (ref 0.7–4.0)
MCHC: 33.1 g/dL (ref 30.0–36.0)
MCV: 92.9 fl (ref 78.0–100.0)
Monocytes Absolute: 0.5 10*3/uL (ref 0.1–1.0)
Monocytes Relative: 10.9 % (ref 3.0–12.0)
Neutro Abs: 2.5 10*3/uL (ref 1.4–7.7)
Neutrophils Relative %: 57.1 % (ref 43.0–77.0)
Platelets: 291 10*3/uL (ref 150.0–400.0)
RBC: 5.22 Mil/uL (ref 4.22–5.81)
RDW: 12.7 % (ref 11.5–15.5)
WBC: 4.3 10*3/uL (ref 4.0–10.5)

## 2021-05-02 LAB — COMPREHENSIVE METABOLIC PANEL
ALT: 16 U/L (ref 0–53)
AST: 24 U/L (ref 0–37)
Albumin: 4.3 g/dL (ref 3.5–5.2)
Alkaline Phosphatase: 54 U/L (ref 39–117)
BUN: 9 mg/dL (ref 6–23)
CO2: 29 mEq/L (ref 19–32)
Calcium: 9.5 mg/dL (ref 8.4–10.5)
Chloride: 101 mEq/L (ref 96–112)
Creatinine, Ser: 0.87 mg/dL (ref 0.40–1.50)
GFR: 91.3 mL/min (ref >60.00)
Glucose, Bld: 92 mg/dL (ref 70–99)
Potassium: 4.3 mEq/L (ref 3.5–5.1)
Sodium: 136 mEq/L (ref 135–145)
Total Bilirubin: 0.8 mg/dL (ref 0.2–1.2)
Total Protein: 7.2 g/dL (ref 6.0–8.3)

## 2021-05-02 LAB — LIPID PANEL
Cholesterol: 245 mg/dL — ABNORMAL HIGH (ref 0–200)
HDL: 53.9 mg/dL (ref >39.00)
LDL Cholesterol: 164 mg/dL — ABNORMAL HIGH (ref 0–99)
NonHDL: 191.06
Total CHOL/HDL Ratio: 5
Triglycerides: 137 mg/dL (ref 0.0–149.0)
VLDL: 27.4 mg/dL (ref 0.0–40.0)

## 2021-05-02 LAB — PSA: PSA: 0.89 ng/mL (ref 0.10–4.00)

## 2021-05-02 NOTE — Patient Instructions (Signed)
It was great to see you! ? ?Please go to the lab for blood work.  ? ?Our office will call you with your results unless you have chosen to receive results via MyChart. ? ?If your blood work is normal we will follow-up each year for physicals and as scheduled for chronic medical problems. ? ?If anything is abnormal we will treat accordingly and get you in for a follow-up. ? ?Take care, ? ?Savina Olshefski ?  ? ? ?

## 2021-05-02 NOTE — Telephone Encounter (Signed)
Pt here now.

## 2021-08-01 ENCOUNTER — Encounter: Payer: Self-pay | Admitting: Physician Assistant

## 2021-08-01 ENCOUNTER — Telehealth (INDEPENDENT_AMBULATORY_CARE_PROVIDER_SITE_OTHER): Payer: Commercial Managed Care - PPO | Admitting: Family Medicine

## 2021-08-01 ENCOUNTER — Other Ambulatory Visit: Payer: Self-pay

## 2021-08-01 ENCOUNTER — Encounter: Payer: Self-pay | Admitting: Family Medicine

## 2021-08-01 ENCOUNTER — Telehealth: Payer: Commercial Managed Care - PPO | Admitting: Family Medicine

## 2021-08-01 DIAGNOSIS — U071 COVID-19: Secondary | ICD-10-CM | POA: Diagnosis not present

## 2021-08-01 NOTE — Telephone Encounter (Signed)
LVM about patient scheduling appt.

## 2021-08-01 NOTE — Progress Notes (Signed)
MyChart Video Visit    Virtual Visit via Video Note   This visit type was conducted due to national recommendations for restrictions regarding the COVID-19 Pandemic (e.g. social distancing) in an effort to limit this patient's exposure and mitigate transmission in our community. This patient is at least at moderate risk for complications without adequate follow up. This format is felt to be most appropriate for this patient at this time. Physical exam was limited by quality of the video and audio technology used for the visit. CMA was able to get the patient set up on a video visit.  Patient location: Home. Patient and provider in visit Provider location: Office  I discussed the limitations of evaluation and management by telemedicine and the availability of in person appointments. The patient expressed understanding and agreed to proceed.  Visit Date: 08/01/2021  Today's healthcare provider: Wellington Hampshire, MD     Subjective:    Patient ID: Nathaniel Stevens, male    DOB: [birth year], 66 y.o.   MRN: [id number]  Chief Complaint  Patient presents with   Headache    Intermittent headache, was worst last week   Cough    Has phlegm sometimes Sx started last week, tested positive for Covid last Wednesday Sx come and go    Nasal Congestion   Scratchy throat    HPI Chief complaint: covid Symptom onset: 2/6 Pertinent positives: HA-worst last wk, cough, congestion, some achey.  Some mild DOE if strenuous activity.  Tested + for covid on 2/8. Syptoms are waxing and waning. Did have sore throat last wk but better. Had Covid in [place] worse then but only lasted few days.  Kept testing + into August so stopped testing and didn't test since.  Family w/similar symptoms now and all are testing neg.  Wonders if truly covid or test + from July still Pertinent negatives: no f/c Treatments tried: OTC Vaccine status:   Sick exposure: family   Past Medical History:  Diagnosis Date    Arthritis 2014   Blood transfusion without reported diagnosis    Heart murmur 1958   Hepatitis B    due to transfusion   Hepatitis C    due to transfusion   History of chicken pox     Past Surgical History:  Procedure Laterality Date   [place] REPLACEMENT Right 2012   Rt Knee   KIDNEY SURGERY Left 1981   SHOULDER SURGERY Right 1999   wrist and hand surgery Right 2002    Outpatient Medications Prior to Visit  Medication Sig Dispense Refill   Ferrous Sulfate (IRON PO) Take 1 tablet by mouth daily in the afternoon.     Multiple Vitamin (MULTIVITAMIN WITH MINERALS) TABS tablet Take 1 tablet by mouth daily.     No facility-administered medications prior to visit.    No Known Allergies      Objective:     Physical Exam Vitals and nursing note reviewed.  Constitutional:      General:  is not in acute distress.    Appearance: Normal appearance.  HENT:     Head: Normocephalic.  Pulmonary:     Effort: No respiratory distress.  Musculoskeletal:     Cervical back: Normal range of motion.  Skin:    General: Skin is dry.     Coloration: Skin is not pale.  Neurological:     Mental Status: Pt is alert and oriented to person, place, and time.  Psychiatric:  Mood and Affect: Mood normal.   There were no vitals taken for this visit.  Wt Readings from Last 3 Encounters:  05/02/21 187 lb 8 oz (85 kg)  08/03/20 192 lb 6.4 oz (87.3 kg)  04/04/20 183 lb (83 kg)       Assessment & Plan:   Covid-discussed to mask 2 more days.  Sounds like truly covid even though family negative.  Could still b from July but doubt.   Continue symptomatic tx Problem List Items Addressed This Visit   None Visit Diagnoses     [id number]    -  Primary       No orders of the defined types were placed in this encounter.   I discussed the assessment and treatment plan with the patient. The patient was provided an opportunity to ask questions and all were answered.  The patient agreed with the plan and demonstrated an understanding of the instructions.   The patient was advised to call back or seek an in-person evaluation if the symptoms worsen or if the condition fails to improve as anticipated.  I provided 13 minutes of face-to-face time during this encounter.   Wellington Hampshire, MD [place] [phone] (phone) [phone] (fax)  [place]

## 2021-08-01 NOTE — Patient Instructions (Signed)
Person Under Monitoring Name: Nathaniel Stevens  Location: [place] [address]   Infection Prevention Recommendations for Individuals Confirmed to have, or Being Evaluated for, 2019 Novel Coronavirus (COVID-19) Infection Who Receive Care at Home  Individuals who are confirmed to have, or are being evaluated for, COVID-19 should follow the prevention steps below until a healthcare provider or local or state health department says they can return to normal activities.  Stay home except to get medical care You should restrict activities outside your home, except for getting medical care. Do not go to work, school, or public areas, and do not use public transportation or taxis.  Call ahead before visiting your doctor Before your medical appointment, call the healthcare provider and tell them that you have, or are being evaluated for, COVID-19 infection. This will help the healthcare providers office take steps to keep other people from getting infected. Ask your healthcare provider to call the local or state health department.  Monitor your symptoms Seek prompt medical attention if your illness is worsening (e.g., difficulty breathing). Before going to your medical appointment, call the healthcare provider and tell them that you have, or are being evaluated for, COVID-19 infection. Ask your healthcare provider to call the local or state health department.  Wear a facemask You should wear a facemask that covers your nose and mouth when you are in the same room with other people and when you visit a healthcare provider. People who live with or visit you should also wear a facemask while they are in the same room with you.  Separate yourself from other people in your home As much as possible, you should stay in a different room from other people in your home. Also, you should use a separate bathroom, if available.  Avoid sharing household items You should not share  dishes, drinking glasses, cups, eating utensils, towels, bedding, or other items with other people in your home. After using these items, you should wash them thoroughly with soap and water.  Cover your coughs and sneezes Cover your mouth and nose with a tissue when you cough or sneeze, or you can cough or sneeze into your sleeve. Throw used tissues in a lined trash can, and immediately wash your hands with soap and water for at least 20 seconds or use an alcohol-based hand rub.  Wash your [facility] your hands often and thoroughly with soap and water for at least 20 seconds. You can use an alcohol-based hand sanitizer if soap and water are not available and if your hands are not visibly dirty. Avoid touching your eyes, nose, and mouth with unwashed hands.   Prevention Steps for Caregivers and Household Members of Individuals Confirmed to have, or Being Evaluated for, COVID-19 Infection Being Cared for in the Home  If you live with, or provide care at home for, a person confirmed to have, or being evaluated for, COVID-19 infection please follow these guidelines to prevent infection:  Follow healthcare providers instructions Make sure that you understand and can help the patient follow any healthcare provider instructions for all care.  Provide for the patients basic needs You should help the patient with basic needs in the home and provide support for getting groceries, prescriptions, and other personal needs.  Monitor the patients symptoms If they are getting sicker, call his or her medical provider and tell them that the patient has, or is being evaluated for, COVID-19 infection. This will help the healthcare providers office  take steps to keep other people from getting infected. °Ask the healthcare provider to call the local or state health department. ° °Limit the number of people who have contact with the patient °If possible, have only one caregiver for the patient. °Other  household members should stay in another home or place of residence. If this is not possible, they should stay °in another room, or be separated from the patient as much as possible. Use a separate bathroom, if available. °Restrict visitors who do not have an essential need to be in the home. ° °Keep older adults, very young children, and other sick people away from the patient °Keep older adults, very young children, and those who have compromised immune systems or chronic health conditions away from the patient. This includes people with chronic heart, lung, or kidney conditions, diabetes, and cancer. ° °Ensure good ventilation °Make sure that shared spaces in the home have good air flow, such as from an air conditioner or an opened window, °weather permitting. ° °Wash your hands often °Wash your hands often and thoroughly with soap and water for at least 20 seconds. You can use an alcohol based hand sanitizer if soap and water are not available and if your hands are not visibly dirty. °Avoid touching your eyes, nose, and mouth with unwashed hands. °Use disposable paper towels to dry your hands. If not available, use dedicated cloth towels and replace them when they become wet. ° °Wear a facemask and gloves °Wear a disposable facemask at all times in the room and gloves when you touch or have contact with the patient’s blood, body fluids, and/or secretions or excretions, such as sweat, saliva, sputum, nasal mucus, vomit, urine, or feces.  Ensure the mask fits over your nose and mouth tightly, and do not touch it during use. °Throw out disposable facemasks and gloves after using them. Do not reuse. °Wash your hands immediately after removing your facemask and gloves. °If your personal clothing becomes contaminated, carefully remove clothing and launder. Wash your hands after handling contaminated clothing. °Place all used disposable facemasks, gloves, and other waste in a lined container before disposing them with  other household waste. °Remove gloves and wash your hands immediately after handling these items. ° °Do not share dishes, glasses, or other household items with the patient °Avoid sharing household items. You should not share dishes, drinking glasses, cups, eating utensils, towels, bedding, or other items with a patient who is confirmed to have, or being evaluated for, COVID-19 infection. °After the person uses these items, you should wash them thoroughly with soap and water. ° °Wash laundry thoroughly °Immediately remove and wash clothes or bedding that have blood, body fluids, and/or secretions or excretions, such as sweat, saliva, sputum, nasal mucus, vomit, urine, or feces, on them. °Wear gloves when handling laundry from the patient. °Read and follow directions on labels of laundry or clothing items and detergent. In general, wash and dry with the warmest temperatures recommended on the label. ° °Clean all areas the individual has used often °Clean all touchable surfaces, such as counters, tabletops, doorknobs, bathroom fixtures, toilets, phones, keyboards, tablets, and bedside tables, every day. Also, clean any surfaces that may have blood, body fluids, and/or secretions or excretions on them. °Wear gloves when cleaning surfaces the patient has come in contact with. °Use a diluted bleach solution (e.g., dilute bleach with 1 part bleach and 10 parts water) or a household disinfectant with a label that says EPA-registered for coronaviruses. To make a bleach   solution at home, add 1 tablespoon of bleach to 1 quart (4 cups) of water. For a larger supply, add  cup of bleach to 1 gallon (16 cups) of water. Read labels of cleaning products and follow recommendations provided on product labels. Labels contain instructions for safe and effective use of the cleaning product including precautions you should take when applying the product, such as wearing gloves or eye protection and making sure you have good ventilation  during use of the product. Remove gloves and wash hands immediately after cleaning.  Monitor yourself for signs and symptoms of illness Caregivers and household members are considered close contacts, should monitor their health, and will be asked to limit movement outside of the home to the extent possible. Follow the monitoring steps for close contacts listed on the symptom monitoring form.   ? If you have additional questions, contact your local health department or call the epidemiologist on call at [phone] (available 24/7). ? This guidance is subject to change. For the most up-to-date guidance from [facility], please refer to their website: YouBlogs.pl

## 2021-09-05 ENCOUNTER — Other Ambulatory Visit: Payer: Self-pay

## 2021-09-05 ENCOUNTER — Ambulatory Visit (INDEPENDENT_AMBULATORY_CARE_PROVIDER_SITE_OTHER): Payer: Commercial Managed Care - PPO | Admitting: Physician Assistant

## 2021-09-05 ENCOUNTER — Encounter: Payer: Self-pay | Admitting: Physician Assistant

## 2021-09-05 DIAGNOSIS — L300 Nummular dermatitis: Secondary | ICD-10-CM | POA: Diagnosis not present

## 2021-09-05 DIAGNOSIS — Z1283 Encounter for screening for malignant neoplasm of skin: Secondary | ICD-10-CM

## 2021-09-05 MED ORDER — TRIAMCINOLONE ACETONIDE 0.1 % EX CREA: 1.0000 "application " | TOPICAL_CREAM | Freq: Two times a day (BID) | CUTANEOUS | 3 refills | Status: DC | PRN

## 2021-09-05 NOTE — Progress Notes (Signed)
? ?  Follow-Up Visit ?  ?Subjective  ?Nathaniel Stevens is a 66 y.o. male who presents for the following: Annual Exam (Left thigh- new lesion x 2 years- comes and goes, right thigh- x 2 years - comes and goes. No family or personal history of melanoma or non mole skin cancers. ). ? ? ?The following portions of the chart were reviewed this encounter and updated as appropriate:  Tobacco  Allergies  Meds  Problems  Med Hx  Surg Hx  Fam Hx   ?  ? ?Objective  ?Well appearing patient in no apparent distress; mood and affect are within normal limits. ? ?A full examination was performed including scalp, head, eyes, ears, nose, lips, neck, chest, axillae, abdomen, back, buttocks, bilateral upper extremities, bilateral lower extremities, hands, feet, fingers, toes, fingernails, and toenails. All findings within normal limits unless otherwise noted below. ? ?head to toe ?No atypical nevi or signs of NMSC noted at the time of the visit.  ? ?Left Thigh - Anterior, Right Thigh - Anterior ?Thin scaly erythematous papules coalescing to plaques.  ? ? ?Assessment & Plan  ?Encounter for screening for malignant neoplasm of skin ?head to toe ? ?Yearly skin examination ? ?Nummular dermatitis ?Left Thigh - Anterior; Right Thigh - Anterior ? ?triamcinolone cream (KENALOG) 0.1 % - Left Thigh - Anterior, Right Thigh - Anterior ?Apply 1 application. topically 2 (two) times daily as needed. ? ?No atypical nevi noted at the time of the visit. ? ?I, Khalaya Mcgurn, PA-C, have reviewed all documentation's for this visit.  The documentation on 09/05/21 for the exam, diagnosis, procedures and orders are all accurate and complete. ?

## 2022-03-12 ENCOUNTER — Encounter: Payer: Self-pay | Admitting: *Deleted

## 2022-05-08 ENCOUNTER — Encounter: Payer: Commercial Managed Care - PPO | Admitting: Physician Assistant

## 2022-05-14 ENCOUNTER — Encounter: Payer: Commercial Managed Care - PPO | Admitting: Physician Assistant

## 2022-05-31 ENCOUNTER — Encounter: Payer: Self-pay | Admitting: *Deleted

## 2022-06-07 ENCOUNTER — Ambulatory Visit (INDEPENDENT_AMBULATORY_CARE_PROVIDER_SITE_OTHER): Payer: Commercial Managed Care - PPO | Admitting: Physician Assistant

## 2022-06-07 ENCOUNTER — Encounter: Payer: Self-pay | Admitting: Physician Assistant

## 2022-06-07 VITALS — BP 130/80 | HR 50 | Temp 97.3°F | Ht 70.0 in | Wt 191.5 lb

## 2022-06-07 DIAGNOSIS — Z23 Encounter for immunization: Secondary | ICD-10-CM

## 2022-06-07 DIAGNOSIS — D649 Anemia, unspecified: Secondary | ICD-10-CM

## 2022-06-07 DIAGNOSIS — Z1211 Encounter for screening for malignant neoplasm of colon: Secondary | ICD-10-CM | POA: Diagnosis not present

## 2022-06-07 DIAGNOSIS — M25522 Pain in left elbow: Secondary | ICD-10-CM

## 2022-06-07 DIAGNOSIS — E663 Overweight: Secondary | ICD-10-CM

## 2022-06-07 DIAGNOSIS — Z125 Encounter for screening for malignant neoplasm of prostate: Secondary | ICD-10-CM

## 2022-06-07 DIAGNOSIS — Z0000 Encounter for general adult medical examination without abnormal findings: Secondary | ICD-10-CM

## 2022-06-07 LAB — IBC + FERRITIN
Ferritin: 71.2 ng/mL (ref 22.0–322.0)
Iron: 187 ug/dL — ABNORMAL HIGH (ref 42–165)
Saturation Ratios: 64.2 % — ABNORMAL HIGH (ref 20.0–50.0)
TIBC: 291.2 ug/dL (ref 250.0–450.0)
Transferrin: 208 mg/dL — ABNORMAL LOW (ref 212.0–360.0)

## 2022-06-07 LAB — CBC WITH DIFFERENTIAL/PLATELET
Basophils Absolute: 0.1 10*3/uL (ref 0.0–0.1)
Basophils Relative: 1 % (ref 0.0–3.0)
Eosinophils Absolute: 0.3 10*3/uL (ref 0.0–0.7)
Eosinophils Relative: 4.8 % (ref 0.0–5.0)
HCT: 48.6 % (ref 39.0–52.0)
Hemoglobin: 16.3 g/dL (ref 13.0–17.0)
Lymphocytes Relative: 22.6 % (ref 12.0–46.0)
Lymphs Abs: 1.2 10*3/uL (ref 0.7–4.0)
MCHC: 33.6 g/dL (ref 30.0–36.0)
MCV: 92.8 fl (ref 78.0–100.0)
Monocytes Absolute: 0.6 10*3/uL (ref 0.1–1.0)
Monocytes Relative: 10 % (ref 3.0–12.0)
Neutro Abs: 3.4 10*3/uL (ref 1.4–7.7)
Neutrophils Relative %: 61.6 % (ref 43.0–77.0)
Platelets: 297 10*3/uL (ref 150.0–400.0)
RBC: 5.24 Mil/uL (ref 4.22–5.81)
RDW: 13.1 % (ref 11.5–15.5)
WBC: 5.5 10*3/uL (ref 4.0–10.5)

## 2022-06-07 LAB — COMPREHENSIVE METABOLIC PANEL
ALT: 16 U/L (ref 0–53)
AST: 23 U/L (ref 0–37)
Albumin: 4.3 g/dL (ref 3.5–5.2)
Alkaline Phosphatase: 56 U/L (ref 39–117)
BUN: 9 mg/dL (ref 6–23)
CO2: 30 mEq/L (ref 19–32)
Calcium: 9.6 mg/dL (ref 8.4–10.5)
Chloride: 103 mEq/L (ref 96–112)
Creatinine, Ser: 0.88 mg/dL (ref 0.40–1.50)
GFR: 90.29 mL/min (ref >60.00)
Glucose, Bld: 95 mg/dL (ref 70–99)
Potassium: 4.7 mEq/L (ref 3.5–5.1)
Sodium: 140 mEq/L (ref 135–145)
Total Bilirubin: 1 mg/dL (ref 0.2–1.2)
Total Protein: 6.8 g/dL (ref 6.0–8.3)

## 2022-06-07 LAB — LIPID PANEL
Cholesterol: 251 mg/dL — ABNORMAL HIGH (ref 0–200)
HDL: 53.1 mg/dL (ref >39.00)
LDL Cholesterol: 165 mg/dL — ABNORMAL HIGH (ref 0–99)
NonHDL: 198.03
Total CHOL/HDL Ratio: 5
Triglycerides: 167 mg/dL — ABNORMAL HIGH (ref 0.0–149.0)
VLDL: 33.4 mg/dL (ref 0.0–40.0)

## 2022-06-07 LAB — PSA: PSA: 0.95 ng/mL (ref 0.10–4.00)

## 2022-06-07 NOTE — Patient Instructions (Signed)
It was great to see you!  A referral has been placed for you to see one of our fantastic providers at [facility]. Someone from their office will be in touch soon regarding scheduling your appointment.  Their location:  [place] at [facility]  [address] on the 1st floor Phone number [phone] Fax [phone].   This location is across the street from the entrance to [employer] and in the same complex as the [facility]  Please go to the lab for blood work.   Our office will call you with your results unless you have chosen to receive results via MyChart.  If your blood work is normal we will follow-up each year for physicals and as scheduled for chronic medical problems.  If anything is abnormal we will treat accordingly and get you in for a follow-up.  Take care,  Aldona Bar

## 2022-06-07 NOTE — Addendum Note (Signed)
Addended by: Marian Sorrow on: 06/07/2022 11:50 AM   Modules accepted: Orders

## 2022-06-07 NOTE — Progress Notes (Signed)
Subjective:    Nathaniel Stevens is a 66 y.o. male and is here for a comprehensive physical exam.  HPI  Health Maintenance Due  Topic Date Due   Pneumonia Vaccine 17+ Years old (1 - PCV) Never done   COLONOSCOPY (Pts 45-20yr Insurance coverage will need to be confirmed)  04/04/2022    Acute Concerns: Elbow pain Left elbow pain x6 months. Pain started while performing bicep curls. Stopped swimming and lifting weights due to worsened pain with these activities. Using massage and stretching, no medications. Tried ACE bandage without improvement. Hx similar pain in the left elbow while training for triathlons in the past. Pain took many months to resolve at that time.  Denies any focal weakness or limited ROM. Pain with pronation and supination of forearm.  Chronic Issues: Anemia Taking po iron daily. No longer has brittle nails as he did prior to taking iron. Denies any constipation. H/o hepatitis B. Lab Results  Component Value Date   IRON 40 (L) 04/04/2020   FERRITIN 6 (L) 04/04/2020   Lab Results  Component Value Date   WBC 4.3 05/02/2021   HGB 16.1 05/02/2021   HCT 48.5 05/02/2021   MCV 92.9 05/02/2021   PLT 291.0 05/02/2021    Denies any leg swelling, abdominal pain, chest pain, or shortness of breath.  Health Maintenance: Immunizations -- Received flu and pneumonia vaccines today UTD Tdap last completed 05/24/2014 COVID last completed 04/01/21 ([place] 2 doses and Moderna- 1 dose)  [place] PSA --  Lab Results  Component Value Date   PSA 0.89 05/02/2021   PSA 0.85 04/04/2020   PSA 0.8 03/18/2015   Diet -- Healthy balanced diet Sleep habits -- Doing well, rarely has difficulty sleeping Exercise -- Very active  Weight --  Recent weight history Wt Readings from Last 10 Encounters:  06/07/22 191 lb 8 oz (86.9 kg)  05/02/21 187 lb 8 oz (85 kg)  08/03/20 192 lb 6.4 oz (87.3 kg)   04/04/20 183 lb (83 kg)  05/28/19 181 lb 12.8 oz (82.5 kg)  05/25/19 181 lb 8 oz (82.3 kg)   Body mass index is 27.48 kg/m.  Mood -- No concerns, doing well Alcohol use --  reports current alcohol use of about 4.0 standard drinks of alcohol per week.  Tobacco use --  Tobacco Use: Medium Risk (06/07/2022)   Patient History    Smoking Tobacco Use: Former    Smokeless Tobacco Use: Never    Passive Exposure: Not on file    Eligible for Low Dose CT? no  UTD with eye doctor? Yes UTD with dentist? Yes     06/07/2022    8:18 AM  Depression screen PHQ 2/9  Decreased Interest 0  Down, Depressed, Hopeless 0  PHQ - 2 Score 0    Other providers/specialists: Patient Care Team: WInda Coke PUtahas PCP - General (Physician Assistant) SWarren Danes PA-C as Physician Assistant (Dermatology)    PMHx, SurgHx, SocialHx, Medications, and Allergies were reviewed in the Visit Navigator and updated as appropriate.   Past Medical History:  Diagnosis Date   Arthritis 2014   Blood transfusion without reported diagnosis    Heart murmur 1958   Hepatitis B    due to transfusion   Hepatitis C    due to transfusion   History of chicken pox      Past Surgical History:  Procedure Laterality Date   EYE SURGERY  1999   JOINT REPLACEMENT Right 2012   Rt Knee   KIDNEY SURGERY Left 1981   SHOULDER SURGERY Right 1999   wrist and hand surgery Right 2002     Family History  Problem Relation Age of Onset   Hypertension Father    Hyperlipidemia Father    Heart attack Sister 55   Dementia Maternal Grandmother    CVA Maternal Grandmother    Cancer Neg Hx     Social History   Tobacco Use   Smoking status: Former    Types: Cigarettes    Quit date: 08/03/1977    Years since quitting: 44.8   Smokeless tobacco: Never   Tobacco comments:    as a teen, has not smoked in 55 yrs  Vaping Use   Vaping Use: Never used  Substance Use Topics   Alcohol use: Yes    Alcohol/week: 4.0  standard drinks of alcohol    Types: 4 Glasses of wine per week    Comment: occasionally   Drug use: Never    Review of Systems:   Review of Systems  Constitutional:  Negative for chills, fever, malaise/fatigue and weight loss.  HENT:  Negative for hearing loss, sinus pain and sore throat.   Respiratory:  Negative for cough and hemoptysis.   Cardiovascular:  Negative for chest pain, palpitations, leg swelling and PND.  Gastrointestinal:  Negative for abdominal pain, constipation, diarrhea, heartburn, nausea and vomiting.  Genitourinary:  Negative for dysuria, frequency and urgency.  Musculoskeletal:  Positive for joint pain (left elbow) and myalgias (left elbow). Negative for back pain and neck pain.  Skin:  Negative for itching and rash.  Neurological:  Negative for dizziness, tingling, seizures and headaches.  Endo/Heme/Allergies:  Negative for polydipsia.  Psychiatric/Behavioral:  Negative for depression. The patient is not nervous/anxious.     Objective:    Vitals:   06/07/22 0821  BP: 130/80  Pulse: (!) 50  Temp: (!) 97.3 F (36.3 C)  SpO2: 98%    Body mass index is 27.48 kg/m.  General  Alert, cooperative, no distress, appears stated age  Head:  Normocephalic, without obvious abnormality, atraumatic  Eyes:  PERRL, conjunctiva/corneas clear, EOM's intact, fundi benign, both eyes       Ears:  Normal TM's and external ear canals, both ears  Nose: Nares normal, septum midline, mucosa normal, no drainage or sinus tenderness  Throat: Lips, mucosa, and tongue normal; teeth and gums normal  Neck: Supple, symmetrical, trachea midline, no adenopathy;     thyroid:  No enlargement/tenderness/nodules; no carotid bruit or JVD  Back:   Symmetric, no curvature, ROM normal, no CVA tenderness  Lungs:   Clear to auscultation bilaterally, respirations unlabored  Chest wall:  No tenderness or deformity  Heart:  Regular rate and rhythm, S1 and S2 normal, no murmur, rub or gallop   Abdomen:   Soft, non-tender, bowel sounds active all four quadrants, no masses, no organomegaly  Extremities: Extremities normal, atraumatic, no cyanosis or edema  Prostate : Deferred   Skin: Skin color, texture, turgor normal, no rashes or lesions  Lymph nodes: Cervical, supraclavicular, and axillary nodes normal  Neurologic: CNII-XII grossly intact. Normal strength, sensation and reflexes throughout   AssessmentPlan:   Routine physical examination Today patient counseled on age appropriate routine health concerns for screening and prevention, each reviewed and up to date or declined. Immunizations reviewed and up to date or declined. Labs ordered and reviewed. Risk factors for depression reviewed and negative. Hearing function and  visual acuity are intact. ADLs screened and addressed as needed. Functional ability and level of safety reviewed and appropriate. Education, counseling and referrals performed based on assessed risks today. Patient provided with a copy of personalized plan for preventive services.  Prostate cancer screening Update PSA  Overweight Do not suspect truly overweight given elevated muscle mass  Special screening for malignant neoplasms, colon Update colonoscopy -- referral placed  Need for immunization against influenza Updated today  Left elbow pain Referral to sports med  Anemia, unspecified type Update iron panel and adjust supplementation accordingly   I,Alexis Herring,acting as a scribe for [employer], PA.,have documented all relevant documentation on the behalf of [name], PA,as directed by  [name], PA while in the presence of [name], [place].  I, [name], [place], have reviewed all documentation for this visit. The documentation on 06/07/22 for the exam, diagnosis, procedures, and orders are all accurate and complete.   [name], PA-C [place]

## 2022-06-21 ENCOUNTER — Ambulatory Visit: Payer: Self-pay

## 2022-06-21 ENCOUNTER — Ambulatory Visit (INDEPENDENT_AMBULATORY_CARE_PROVIDER_SITE_OTHER): Payer: Commercial Managed Care - PPO | Admitting: Family Medicine

## 2022-06-21 VITALS — BP 132/80 | HR 57 | Wt 194.0 lb

## 2022-06-21 DIAGNOSIS — M7712 Lateral epicondylitis, left elbow: Secondary | ICD-10-CM | POA: Diagnosis not present

## 2022-06-21 DIAGNOSIS — M25522 Pain in left elbow: Secondary | ICD-10-CM | POA: Diagnosis not present

## 2022-06-21 DIAGNOSIS — G8929 Other chronic pain: Secondary | ICD-10-CM | POA: Diagnosis not present

## 2022-06-21 NOTE — Progress Notes (Signed)
   I, Nathaniel Stevens, LAT, ATC acting as a scribe for Nathaniel Leader, MD.  Subjective:    CC: L elbow pain  HPI: Pt is a 67 y/o male c/o L elbow pain x 6+ month. Pt reports pain started when while doing bicep curls. Pt had to stop his other activities do to his L elbow pain. Pt locates pain to the lateral aspect of her elbow/proximal forearm. Pt notes this is the 3rd occurrence of this pain.   Grip strength: normal Radiates: no Paresthesia: no Aggravates: pronation/supination Treatments tried: massage, stretching, ace wrap  Pertinent review of Systems: No fevers or chills  Relevant historical information: Triathlete.   Objective:    Vitals:   06/21/22 1451  BP: 132/80  Pulse: (!) 57  SpO2: 98%   General: Well Developed, well nourished, and in no acute distress.   MSK: Left elbow: Normal-appearing Nontender lateral epicondyle.  Tender palpation at lateral extensor elbow mass.  Pain with resisted wrist extension. Normal elbow motion. Elbow extension and flexion strength are intact.  Lab and Radiology Results  Diagnostic Limited MSK Ultrasound of: Left lateral elbow Slight avulsion fragment present superficial to lateral epicondyles consistent with lateral epicondylitis. No tear seen at common extensor tendon origin. Impression: Lateral epicondylitis.     Impression and Recommendations:    Assessment and Plan: 67 y.o. male with left elbow pain thought to be due to lateral epicondylitis.  Plan for home exercise program.  Home exercise program reviewed by myself and ATC.  If not improving in a few weeks would recommend formal hand therapy.  Check back as needed.[name]  PDMP not reviewed this encounter. Orders Placed This Encounter  Procedures   [place] LIMITED JOINT SPACE STRUCTURES UP LEFT(NO LINKED CHARGES)    Order Specific Question:   Reason for Exam (SYMPTOM  OR DIAGNOSIS REQUIRED)    Answer:   left elbow pain    Order Specific Question:   Preferred imaging location?     Answer:   [place]   No orders of the defined types were placed in this encounter.   Discussed warning signs or symptoms. Please see discharge instructions. Patient expresses understanding.   The above documentation has been reviewed and is accurate and complete Nathaniel Stevens, M.D.

## 2022-06-21 NOTE — Patient Instructions (Addendum)
Thank you for coming in today.   Please complete the exercises that the athletic trainer went over with you:  View at [email or web address] using code: [id number]  Let me know if not improving and we can refer to occupational therapy.

## 2022-07-27 ENCOUNTER — Encounter: Payer: Self-pay | Admitting: Family Medicine

## 2022-08-02 ENCOUNTER — Other Ambulatory Visit: Payer: Self-pay

## 2022-08-02 DIAGNOSIS — G8929 Other chronic pain: Secondary | ICD-10-CM

## 2022-08-02 DIAGNOSIS — M7712 Lateral epicondylitis, left elbow: Secondary | ICD-10-CM

## 2022-08-06 NOTE — Therapy (Signed)
OUTPATIENT OCCUPATIONAL THERAPY ORTHO EVALUATION  Patient Name: Nathaniel Stevens MRN: [id number] DOB:1956/03/18, 67 y.o., male Today's Date: 08/07/2022  PCP: [name], PA REFERRING PROVIDER: Gregor Hams, MD   END OF SESSION:  OT End of Session - 08/07/22 0805     Visit Number 1    Number of Visits 8    Date for OT Re-Evaluation 09/21/22    Authorization Type [employer]    OT Start Time 0805    OT Stop Time 0855    OT Time Calculation (min) 50 min    Activity Tolerance Patient tolerated treatment well;No increased pain;Patient limited by fatigue;Patient limited by pain    Behavior During Therapy [facility] for tasks assessed/performed             Past Medical History:  Diagnosis Date   Arthritis 2014   Blood transfusion without reported diagnosis    Heart murmur 1958   Hepatitis B    due to transfusion   Hepatitis C    due to transfusion   History of chicken pox    Past Surgical History:  Procedure Laterality Date   [facility] Right 2012   Rt Knee   KIDNEY SURGERY Left 1981   SHOULDER SURGERY Right 1999   wrist and hand surgery Right 2002   Patient Active Problem List   Diagnosis Date Noted   History of hepatitis B 08/03/2020    ONSET DATE: About 8 months + pain   REFERRING DIAG:  M77.12 (ICD-10-CM) - Lateral epicondylitis, left elbow  M25.522,G89.29 (ICD-10-CM) - Chronic elbow pain, left    THERAPY DIAG:  Lateral epicondylitis, left elbow  Chronic elbow pain, left  Muscle weakness (generalized)  Pain in left elbow  Stiffness of left wrist, not elsewhere classified  Rationale for Evaluation and Treatment: Rehabilitation  SUBJECTIVE:   SUBJECTIVE STATEMENT: 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    PERTINENT HISTORY: Per MD notes: "67 y/o male c/o L elbow pain x 6+ month. Pt reports pain started when while doing bicep curls. Pt had to stop his other activities do to his L elbow pain. Pt locates pain to the  lateral aspect of her elbow/proximal forearm. Pt notes this is the 3rd occurrence of this pain."  He also states breaking Rt wrist 20+ years ago.   PRECAUTIONS: None  WEIGHT BEARING RESTRICTIONS: No  PAIN:  Are you having pain? Yes in Lt elbow  Rating: 2/10 at rest now, up to 3.5/10 at worst in past week   FALLS: Has patient fallen in last 6 months? No  LIVING ENVIRONMENT: Lives with: lives with their family  PLOF: Independent  PATIENT GOALS: To have less pain and problems in the left elbow as this has been lingering and nagging pain for over 8 months   OBJECTIVE: (All objective assessments below are from initial evaluation on: 08/07/22 unless otherwise specified.)   HAND DOMINANCE: Right   ADLs: Overall ADLs: States decreased ability to grab, hold household objects, pain and inability to open containers, perform FMS tasks (manipulate fasteners on clothing), mild to moderate bathing problems as well.    FUNCTIONAL OUTCOME MEASURES: Eval: Patient Specific Functional Scale: 3 (looking at watch, swimming, lifting trash bag)  (Higher Score  =  Better Ability for the Selected Tasks)      UPPER EXTREMITY ROM     Shoulder to Wrist AROM Left eval  Elbow flexion full  Elbow extension full  Forearm supination 81  Forearm pronation  67  Wrist flexion 82  Wrist extension 64  (Blank rows = not tested)   Hand AROM Left eval  Full Fist Ability (or Gap to Distal Palmar Crease) full  Thumb Opposition  (Kapandji Scale)  10  (Blank rows = not tested)   UPPER EXTREMITY MMT:      MMT Left 08/07/22  Elbow flexion   Elbow extension   Forearm supination 4/5  Forearm pronation 5/5  Wrist flexion 5/5  Wrist extension 4/5  Wrist ulnar deviation   Wrist radial deviation   (Blank rows = not tested)  HAND FUNCTION: Eval: only minor observed weakness in affected lt hand.  Grip strength Right: 95 lbs, Left: 84 lbs   COORDINATION: Eval: No significant observed coordination  impairments with affected lt arm/ hand.  SENSATION: Eval:  Light touch intact today  EDEMA:   Eval: none significant  OBSERVATIONS:   Eval: Mildly positive resisted wrist extension test, tender to palpation at lateral epicondyle, no significant nerve tenderness today.  He seems sensitive at the supinator and when passively moving into pronation.  Presents like basic lateral epicondylitis of a chronic nature.   TODAY'S TREATMENT:  Post-evaluation treatment: OT starts with self-care education to avoid exacerbation of pain and symptoms, he should avoid provocative positions of lifting with outstretched arm in pronated position or prolonged elbow postures or wrist extended postures.  He is encouraged to monitor his sleeping positions and work positions as well as typing and holding his cell phone.  Next he was given the following education on homework exercise program to stretch out the tight muscles affected by this condition.  He was encouraged to use heat 5 minutes before and ice at the end of exercises as tolerated for 5 minutes if sore or inflamed.  He states understanding and demos back these exercises well with no pain but feeling healthy stretches.  If anything hurts he was advised to stop, come back in and talk to me about it.   Exercises - Tricep Stretch- DO SEATED BY TABLE  - 3-4 x daily - 3-5 reps - 15 hold - Forearm Pronation Stretch  - 3-4 x daily - 3-5 reps - 15 sec hold - Wrist Flexion Stretch  - 4 x daily - 3-5 reps - 15 sec hold - Wrist Extension Stretch Pronated  - 4 x daily - 3-5 reps - 15 hold   PATIENT EDUCATION: Education details: See tx section above for details  Person educated: Patient Education method: Verbal Instruction, Teach back, Handouts  Education comprehension: States and demonstrates understanding, Additional Education required    HOME EXERCISE PROGRAM: Access Code: [email or web address] URL: [email or web address] Date: 08/07/2022 Prepared by:  Benito Mccreedy   GOALS: Goals reviewed with patient? Yes   SHORT TERM GOALS: (STG required if POC>30 days) Target Date: 08/17/22   Pt will demo/state understanding of initial HEP to improve pain levels and prerequisite motion. Goal status: INITIAL   LONG TERM GOALS: Target Date: 09/21/22  Pt will improve functional ability by decreased impairment per PSFS assessment from 3 to 7 or better, for better quality of life. Goal status: INITIAL  2.  Pt will improve grip strength in Lt hand from 84lbs to at least 90lbs for functional use at home and in IADLs. Goal status: INITIAL  3.  Pt will improve A/ROM in Lt FA pronation from 67* to at least 75*, to have functional motion for tasks like reach and grasp.  Goal status: INITIAL  4.  Pt will  improve strength in Lt wrist ext and supination from 4/5 MMT to at least 4+/5 MMT to have increased functional ability to carry out selfcare and higher-level homecare tasks with no difficulty. Goal status: INITIAL  5.  Pt will decrease pain at rest from 2/10 to 0/10 or better consistently to have better sleep and occupational participation in daily roles. Goal status: INITIAL   ASSESSMENT:  CLINICAL IMPRESSION: Patient is a 67 y.o. 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  These things give him pain and symptoms and decreased quality of life, but he will benefit from outpatient occupational therapy to increase quality of life.   PERFORMANCE DEFICITS: in functional skills including ADLs, IADLs, ROM, strength, pain, fascial restrictions, muscle spasms, flexibility, body mechanics, endurance, decreased knowledge of precautions, and UE functional use, cognitive skills including problem solving and safety awareness, and psychosocial skills including coping strategies, habits, and  routines and behaviors.   IMPAIRMENTS: are limiting patient from ADLs, IADLs, work, and leisure.   COMORBIDITIES: may have co-morbidities  that affects occupational performance. Patient will benefit from skilled OT to address above impairments and improve overall function.  MODIFICATION OR ASSISTANCE TO COMPLETE EVALUATION: No modification of tasks or assist necessary to complete an evaluation.  OT OCCUPATIONAL PROFILE AND HISTORY: Problem focused assessment: Including review of records relating to presenting problem.  CLINICAL DECISION MAKING: LOW - limited treatment options, no task modification necessary  REHAB POTENTIAL: Excellent  EVALUATION COMPLEXITY: Low      PLAN:  OT FREQUENCY: 1x/week  OT DURATION: 6 weeks (through 09/21/22 as needed)   PLANNED INTERVENTIONS: self care/ADL training, therapeutic exercise, therapeutic activity, neuromuscular re-education, manual therapy, passive range of motion, splinting, electrical stimulation, ultrasound, compression bandaging, moist heat, cryotherapy, patient/family education, energy conservation, coping strategies training, and Dry needling  RECOMMENDED OTHER SERVICES: None now  CONSULTED AND AGREED WITH PLAN OF CARE: Patient  PLAN FOR NEXT SESSION:  Review initial home exercise program, use modalities and manual therapy as indicated and tolerated to decrease symptoms and work toward goals   [employer], OTR/L, CHT 08/07/2022, 9:06 AM

## 2022-08-07 ENCOUNTER — Other Ambulatory Visit: Payer: Self-pay

## 2022-08-07 ENCOUNTER — Encounter: Payer: Self-pay | Admitting: Rehabilitative and Restorative Service Providers"

## 2022-08-07 ENCOUNTER — Ambulatory Visit (INDEPENDENT_AMBULATORY_CARE_PROVIDER_SITE_OTHER): Payer: Commercial Managed Care - PPO | Admitting: Rehabilitative and Restorative Service Providers"

## 2022-08-07 DIAGNOSIS — M7712 Lateral epicondylitis, left elbow: Secondary | ICD-10-CM | POA: Diagnosis not present

## 2022-08-07 DIAGNOSIS — M25522 Pain in left elbow: Secondary | ICD-10-CM | POA: Diagnosis not present

## 2022-08-07 DIAGNOSIS — G8929 Other chronic pain: Secondary | ICD-10-CM

## 2022-08-07 DIAGNOSIS — M25632 Stiffness of left wrist, not elsewhere classified: Secondary | ICD-10-CM

## 2022-08-07 DIAGNOSIS — M6281 Muscle weakness (generalized): Secondary | ICD-10-CM

## 2022-08-15 NOTE — Therapy (Incomplete)
OUTPATIENT OCCUPATIONAL THERAPY TREATMENT NOTE  Patient Name: Nathaniel Stevens MRN: [id number] DOB:1956-02-10, 67 y.o., male Today's Date: 08/15/2022  PCP: [name], PA REFERRING PROVIDER: Gregor Hams, MD   END OF SESSION:    Past Medical History:  Diagnosis Date   Arthritis 2014   Blood transfusion without reported diagnosis    Heart murmur 1958   Hepatitis B    due to transfusion   Hepatitis C    due to transfusion   History of chicken pox    Past Surgical History:  Procedure Laterality Date   [place] REPLACEMENT Right 2012   Rt Knee   KIDNEY SURGERY Left 1981   SHOULDER SURGERY Right 1999   wrist and hand surgery Right 2002   Patient Active Problem List   Diagnosis Date Noted   History of hepatitis B 08/03/2020    ONSET DATE: About 8 months + pain   REFERRING DIAG:  M77.12 (ICD-10-CM) - Lateral epicondylitis, left elbow  M25.522,G89.29 (ICD-10-CM) - Chronic elbow pain, left    THERAPY DIAG:  No diagnosis found.  Rationale for Evaluation and Treatment: Rehabilitation  PERTINENT HISTORY: Per MD notes: "67 y/o male c/o L elbow pain x 6+ month. Pt reports pain started when while doing bicep curls. Pt had to stop his other activities do to his L elbow pain. Pt locates pain to the lateral aspect of her elbow/proximal forearm. Pt notes this is the 3rd occurrence of this pain."  He also states breaking Rt wrist 20+ years ago. 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   PRECAUTIONS: None; WEIGHT BEARING RESTRICTIONS: No   SUBJECTIVE:   SUBJECTIVE STATEMENT: He states ***     PAIN:  Are you having pain? *** Yes in Lt elbow  Rating: 2/10 at rest now, up to 3.5/10 at worst in past week  PATIENT GOALS: To have less pain and problems in the left elbow as this has been lingering and nagging pain for over 8 months   OBJECTIVE: (All objective assessments below are from initial evaluation on: 08/07/22 unless otherwise specified.)   HAND  DOMINANCE: Right   ADLs: Overall ADLs: States decreased ability to grab, hold household objects, pain and inability to open containers, perform FMS tasks (manipulate fasteners on clothing), mild to moderate bathing problems as well.    FUNCTIONAL OUTCOME MEASURES: Eval: Patient Specific Functional Scale: 3 (looking at watch, swimming, lifting trash bag)  (Higher Score  =  Better Ability for the Selected Tasks)      UPPER EXTREMITY ROM     Shoulder to Wrist AROM Left eval  Elbow flexion full  Elbow extension full  Forearm supination 81  Forearm pronation  67  Wrist flexion 82  Wrist extension 64  (Blank rows = not tested)   Hand AROM Left eval  Full Fist Ability (or Gap to Distal Palmar Crease) full  Thumb Opposition  (Kapandji Scale)  10  (Blank rows = not tested)   UPPER EXTREMITY MMT:      MMT Left 08/07/22  Elbow flexion   Elbow extension   Forearm supination 4/5  Forearm pronation 5/5  Wrist flexion 5/5  Wrist extension 4/5  Wrist ulnar deviation   Wrist radial deviation   (Blank rows = not tested)  HAND FUNCTION: Eval: only minor observed weakness in affected lt hand.  Grip strength Right: 95 lbs, Left: 84 lbs   OBSERVATIONS:   Eval: Mildly positive resisted wrist extension test, tender to  palpation at lateral epicondyle, no significant nerve tenderness today.  He seems sensitive at the supinator and when passively moving into pronation.  Presents like basic lateral epicondylitis of a chronic nature.   TODAY'S TREATMENT:  08/16/22: *** Review initial home exercise program, use modalities and manual therapy as indicated and tolerated to decrease symptoms and work toward goals   Exercises - Tricep Stretch- DO SEATED BY TABLE  - 3-4 x daily - 3-5 reps - 15 hold - Forearm Pronation Stretch  - 3-4 x daily - 3-5 reps - 15 sec hold - Wrist Flexion Stretch  - 4 x daily - 3-5 reps - 15 sec hold - Wrist Extension Stretch Pronated  - 4 x daily - 3-5 reps - 15  hold   PATIENT EDUCATION: Education details: See tx section above for details  Person educated: Patient Education method: Verbal Instruction, Teach back, Handouts  Education comprehension: States and demonstrates understanding, Additional Education required    HOME EXERCISE PROGRAM: Access Code: [email or web address] URL: [email or web address]   GOALS: Goals reviewed with patient? Yes   SHORT TERM GOALS: (STG required if POC>30 days) Target Date: 08/17/22   Pt will demo/state understanding of initial HEP to improve pain levels and prerequisite motion. Goal status: INITIAL   LONG TERM GOALS: Target Date: 09/21/22  Pt will improve functional ability by decreased impairment per PSFS assessment from 3 to 7 or better, for better quality of life. Goal status: INITIAL  2.  Pt will improve grip strength in Lt hand from 84lbs to at least 90lbs for functional use at home and in IADLs. Goal status: INITIAL  3.  Pt will improve A/ROM in Lt FA pronation from 67* to at least 75*, to have functional motion for tasks like reach and grasp.  Goal status: INITIAL  4.  Pt will improve strength in Lt wrist ext and supination from 4/5 MMT to at least 4+/5 MMT to have increased functional ability to carry out selfcare and higher-level homecare tasks with no difficulty. Goal status: INITIAL  5.  Pt will decrease pain at rest from 2/10 to 0/10 or better consistently to have better sleep and occupational participation in daily roles. Goal status: INITIAL   ASSESSMENT:  CLINICAL IMPRESSION: 08/16/22: ***  Eval: Patient is a 66 y.o. 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  These things give him pain and symptoms and decreased quality of life, but he will benefit from outpatient occupational therapy to increase quality of  life.    PLAN:  OT FREQUENCY: 1x/week  OT DURATION: 6 weeks (through 09/21/22 as needed)   PLANNED INTERVENTIONS: self care/ADL training, therapeutic exercise, therapeutic activity, neuromuscular re-education, manual therapy, passive range of motion, splinting, electrical stimulation, ultrasound, compression bandaging, moist heat, cryotherapy, patient/family education, energy conservation, coping strategies training, and Dry needling  CONSULTED AND AGREED WITH PLAN OF CARE: Patient  PLAN FOR NEXT SESSION:  ***  Benito Mccreedy, OTR/L, CHT 08/15/2022, 10:53 AM

## 2022-08-17 ENCOUNTER — Encounter: Payer: Commercial Managed Care - PPO | Admitting: Rehabilitative and Restorative Service Providers"

## 2022-08-20 NOTE — Therapy (Signed)
OUTPATIENT OCCUPATIONAL THERAPY TREATMENT NOTE  Patient Name: Nathaniel Stevens MRN: [id number] DOB:12/30/1955, 67 y.o., male Today's Date: 08/21/2022  PCP: [name], PA REFERRING PROVIDER: Gregor Hams, MD   END OF SESSION:  OT End of Session - 08/21/22 [phone]     Visit Number 2    Number of Visits 8    Date for OT Re-Evaluation 09/21/22    Authorization Type [employer]    OT Start Time [id number]    OT Stop Time 0844    OT Time Calculation (min) 32 min    Activity Tolerance Patient tolerated treatment well;No increased pain;Patient limited by fatigue;Patient limited by pain    Behavior During Therapy [facility] for tasks assessed/performed              Past Medical History:  Diagnosis Date   Arthritis 2014   Blood transfusion without reported diagnosis    Heart murmur 1958   Hepatitis B    due to transfusion   Hepatitis C    due to transfusion   History of chicken pox    Past Surgical History:  Procedure Laterality Date   [place] REPLACEMENT Right 2012   Rt Knee   KIDNEY SURGERY Left 1981   SHOULDER SURGERY Right 1999   wrist and hand surgery Right 2002   Patient Active Problem List   Diagnosis Date Noted   History of hepatitis B 08/03/2020    ONSET DATE: About 8 months + pain   REFERRING DIAG:  M77.12 (ICD-10-CM) - Lateral epicondylitis, left elbow  M25.522,G89.29 (ICD-10-CM) - Chronic elbow pain, left    THERAPY DIAG:  Lateral epicondylitis, left elbow  Chronic elbow pain, left  Muscle weakness (generalized)  Stiffness of left wrist, not elsewhere classified  Pain in left elbow  Rationale for Evaluation and Treatment: Rehabilitation  PERTINENT HISTORY: Per MD notes: "67 y/o male c/o L elbow pain x 6+ month. Pt reports pain started when while doing bicep curls. Pt had to stop his other activities do to his L elbow pain. Pt locates pain to the lateral aspect of her elbow/proximal forearm. Pt notes this is the 3rd occurrence of this pain."   He also states breaking Rt wrist 20+ years ago. 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   PRECAUTIONS: None; WEIGHT BEARING RESTRICTIONS: No   SUBJECTIVE:   SUBJECTIVE STATEMENT: [name], states doing better, knowing that HEP is working, but also not doing it as much as he "should" due to being busy at work.     PAIN:  Are you having pain? No   Rating: 0/10 at rest now, up to 3.5/10 at worst in past week  PATIENT GOALS: To have less pain and problems in the left elbow as this has been lingering and nagging pain for over 8 months   OBJECTIVE: (All objective assessments below are from initial evaluation on: 08/07/22 unless otherwise specified.)   HAND DOMINANCE: Right   ADLs: Overall ADLs: States decreased ability to grab, hold household objects, pain and inability to open containers, perform FMS tasks (manipulate fasteners on clothing), mild to moderate bathing problems as well.    FUNCTIONAL OUTCOME MEASURES: Eval: Patient Specific Functional Scale: 3 (looking at watch, swimming, lifting trash bag)  (Higher Score  =  Better Ability for the Selected Tasks)      UPPER EXTREMITY ROM     Shoulder to Wrist AROM Left eval  Elbow flexion full  Elbow extension  full  Forearm supination 81  Forearm pronation  67  Wrist flexion 82  Wrist extension 64  (Blank rows = not tested)   Hand AROM Left eval  Full Fist Ability (or Gap to Distal Palmar Crease) full  Thumb Opposition  (Kapandji Scale)  10  (Blank rows = not tested)   UPPER EXTREMITY MMT:      MMT Left 08/07/22  Elbow flexion   Elbow extension   Forearm supination 4/5  Forearm pronation 5/5  Wrist flexion 5/5  Wrist extension 4/5  Wrist ulnar deviation   Wrist radial deviation   (Blank rows = not tested)  HAND FUNCTION: Eval: only minor observed weakness in affected lt hand.  Grip strength Right: 95 lbs, Left: 84 lbs   OBSERVATIONS:   Eval: Mildly positive resisted wrist extension test,  tender to palpation at lateral epicondyle, no significant nerve tenderness today.  He seems sensitive at the supinator and when passively moving into pronation.  Presents like basic lateral epicondylitis of a chronic nature.   TODAY'S TREATMENT:  08/21/22: He starts with 3 minutes moist heat while OT verbally reviews his home exercise program.  Then OT performs manual therapy IASTM to the dorsal forearm from the lateral epicondyle to the back of the wrist and up into the tricep area times feeling some rolling and tight spasming muscles.  He states this feels a bit looser at the end, then he performs his home exercise program stretches back to OT with cues and supervision for correct performance as listed below.  He is also educated on a new exercise the radial nerve glide today as the radial nerve is often involved with this pain and problematic lateral epicondylitis.  He states feeling an active stretch through his nerve in his arm when performing.  He is encouraged to do nothing painful to his arm and wear his brace as often as needed, and at the end of the session OT also performs manual therapy dry needling modality, achieving a nice local twitch response and no lingering pain or adverse reactions at the lateral epicondyle extensor wad.  OT uses a 0.30 x 72m needle inserted only partly to achieve this effect.  He leaves stating having some soreness from this but no other pains or problems.    Exercises - Tricep Stretch- DO SEATED BY TABLE  - 3-4 x daily - 3-5 reps - 15 hold - Forearm Pronation Stretch  - 3-4 x daily - 3-5 reps - 15 sec hold - Wrist Flexion Stretch  - 4 x daily - 3-5 reps - 15 sec hold - Wrist Extension Stretch Pronated  - 4 x daily - 3-5 reps - 15 hold - Radial Nerve Flossing  - 2-3 x daily - 5-10 reps  PATIENT EDUCATION: Education details: See tx section above for details  Person educated: Patient Education method: Verbal Instruction, Teach back, Handouts  Education  comprehension: States and demonstrates understanding, Additional Education required    HOME EXERCISE PROGRAM: Access Code: [email or web address] URL: [email or web address]   GOALS: Goals reviewed with patient? Yes   SHORT TERM GOALS: (STG required if POC>30 days) Target Date: 08/17/22   Pt will demo/state understanding of initial HEP to improve pain levels and prerequisite motion. Goal status: 08/21/22: MET   LONG TERM GOALS: Target Date: 09/21/22  Pt will improve functional ability by decreased impairment per PSFS assessment from 3 to 7 or better, for better quality of life. Goal status: INITIAL  2.  Pt will improve grip strength in  Lt hand from 84lbs to at least 90lbs for functional use at home and in IADLs. Goal status: INITIAL  3.  Pt will improve A/ROM in Lt FA pronation from 67* to at least 75*, to have functional motion for tasks like reach and grasp.  Goal status: INITIAL  4.  Pt will improve strength in Lt wrist ext and supination from 4/5 MMT to at least 4+/5 MMT to have increased functional ability to carry out selfcare and higher-level homecare tasks with no difficulty. Goal status: INITIAL  5.  Pt will decrease pain at rest from 2/10 to 0/10 or better consistently to have better sleep and occupational participation in daily roles. Goal status: INITIAL   ASSESSMENT:  CLINICAL IMPRESSION: 08/21/22: He is doing very well although admittedly busy at work and unable to stretch as often as "he needs to."  He tolerated dry needling very well today and hopefully this modality helps encourage his healing as well.  If he continues to do well we can try isometric or eccentric strengthening gently next week.  PLAN:  OT FREQUENCY: 1x/week  OT DURATION: 6 weeks (through 09/21/22 as needed)   PLANNED INTERVENTIONS: self care/ADL training, therapeutic exercise, therapeutic activity, neuromuscular re-education, manual therapy, passive range of motion, splinting, electrical  stimulation, ultrasound, compression bandaging, moist heat, cryotherapy, patient/family education, energy conservation, coping strategies training, and Dry needling  CONSULTED AND AGREED WITH PLAN OF CARE: Patient  PLAN FOR NEXT SESSION:  Review HEP and do manual therapy as needed and try to start light strengthening as tolerated.  Benito Mccreedy, OTR/L, CHT 08/21/2022, 8:51 AM

## 2022-08-21 ENCOUNTER — Encounter: Payer: Self-pay | Admitting: Rehabilitative and Restorative Service Providers"

## 2022-08-21 ENCOUNTER — Ambulatory Visit (INDEPENDENT_AMBULATORY_CARE_PROVIDER_SITE_OTHER): Payer: Commercial Managed Care - PPO | Admitting: Rehabilitative and Restorative Service Providers"

## 2022-08-21 DIAGNOSIS — M6281 Muscle weakness (generalized): Secondary | ICD-10-CM

## 2022-08-21 DIAGNOSIS — M25522 Pain in left elbow: Secondary | ICD-10-CM

## 2022-08-21 DIAGNOSIS — M7712 Lateral epicondylitis, left elbow: Secondary | ICD-10-CM

## 2022-08-21 DIAGNOSIS — M25632 Stiffness of left wrist, not elsewhere classified: Secondary | ICD-10-CM

## 2022-08-21 DIAGNOSIS — G8929 Other chronic pain: Secondary | ICD-10-CM

## 2022-09-04 ENCOUNTER — Encounter: Payer: Commercial Managed Care - PPO | Admitting: Rehabilitative and Restorative Service Providers"

## 2022-09-18 ENCOUNTER — Encounter: Payer: Commercial Managed Care - PPO | Admitting: Rehabilitative and Restorative Service Providers"

## 2022-10-02 ENCOUNTER — Encounter: Payer: Commercial Managed Care - PPO | Admitting: Rehabilitative and Restorative Service Providers"

## 2022-10-08 NOTE — Therapy (Signed)
OUTPATIENT OCCUPATIONAL THERAPY TREATMENT & DISCHARGE NOTE  Patient Name: Nathaniel Stevens MRN: [id number] DOB:[birth year], 67 y.o., male Today's Date: 10/09/2022  PCP: Jarold Motto, PA REFERRING PROVIDER: Rodolph Bong, MD   Progress Note Reporting Period 08/07/21 to 10/09/22.   See note below for Objective Data and Assessment of Progress/Goals.      END OF SESSION:  OT End of Session - 10/09/22 0939     Visit Number 3    Number of Visits 8    Date for OT Re-Evaluation 10/09/22    Authorization Type [employer]    OT Start Time [phone]    OT Stop Time 1011    OT Time Calculation (min) 32 min    Equipment Utilized During Treatment t-bands    Activity Tolerance Patient tolerated treatment well;No increased pain    Behavior During Therapy WFL for tasks assessed/performed              Past Medical History:  Diagnosis Date   Arthritis 2014   Blood transfusion without reported diagnosis    Heart murmur 1958   Hepatitis B    due to transfusion   Hepatitis C    due to transfusion   History of chicken pox    Past Surgical History:  Procedure Laterality Date   EYE SURGERY  1999   [place] REPLACEMENT Right 2012   Rt Knee   KIDNEY SURGERY Left 1981   SHOULDER SURGERY Right 1999   wrist and hand surgery Right 2002   Patient Active Problem List   Diagnosis Date Noted   History of hepatitis B 08/03/2020    ONSET DATE: About 8 months + pain   REFERRING DIAG:  M77.12 (ICD-10-CM) - Lateral epicondylitis, left elbow  M25.522,G89.29 (ICD-10-CM) - Chronic elbow pain, left    THERAPY DIAG:  Lateral epicondylitis, left elbow  Stiffness of left wrist, not elsewhere classified  Pain in left elbow  Chronic elbow pain, left  Muscle weakness (generalized)  Rationale for Evaluation and Treatment: Rehabilitation  PERTINENT HISTORY: Per MD notes: "67 y/o male c/o L elbow pain x 6+ month. Pt reports pain started when while doing bicep curls. Pt had to stop his other activities  do to his L elbow pain. Pt locates pain to the lateral aspect of her elbow/proximal forearm. Pt notes this is the 3rd occurrence of this pain."  He also states breaking Rt wrist 20+ years ago. 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   PRECAUTIONS: None; WEIGHT BEARING RESTRICTIONS: No   SUBJECTIVE:   SUBJECTIVE STATEMENT: He arrives a bit late after ~6 weeks, states now doing pushups and kind of "forgetting about it." He seems to be doing well.    PAIN:  Are you having pain? No   Rating: 0/10 at rest now, up to 2-3/10 at worst in past week  PATIENT GOALS: To have less pain and problems in the left elbow as this has been lingering and nagging pain for over 8 months   OBJECTIVE: (All objective assessments below are from initial evaluation on: 08/07/22 unless otherwise specified.)   HAND DOMINANCE: Right   ADLs: Overall ADLs: States decreased ability to grab, hold household objects, pain and inability to open containers, perform FMS tasks (manipulate fasteners on clothing), mild to moderate bathing problems as well.    FUNCTIONAL OUTCOME MEASURES: 10/09/22: PSFS: 8.5  Eval: Patient Specific Functional Scale: 3 (looking at watch, swimming, lifting trash bag)  (Higher Score  =  Better Ability for the Selected Tasks)  UPPER EXTREMITY ROM     Shoulder to Wrist AROM Left eval Lt 10/09/22  Elbow flexion full full  Elbow extension full full  Forearm supination 81 80  Forearm pronation  67 77  Wrist flexion 82 73  Wrist extension 64 59  (Blank rows = not tested)   Hand AROM Left eval  Full Fist Ability (or Gap to Distal Palmar Crease) full  Thumb Opposition  (Kapandji Scale)  10  (Blank rows = not tested)   UPPER EXTREMITY MMT:      MMT Left 08/07/22 Lt 10/09/22  Elbow flexion  5/5 slight tenderness  Elbow extension  5/5  Forearm supination 4/5 5/5  Forearm pronation 5/5 5/5  Wrist flexion 5/5 5/5 tender  Wrist extension 4/5 5/5  (Blank rows = not tested)  HAND  FUNCTION: 10/09/22: Lt grip: 88#  Eval: only minor observed weakness in affected lt hand.  Grip strength Right: 95 lbs, Left: 84 lbs   OBSERVATIONS:   Eval: Mildly positive resisted wrist extension test, tender to palpation at lateral epicondyle, no significant nerve tenderness today.  He seems sensitive at the supinator and when passively moving into pronation.  Presents like basic lateral epicondylitis of a chronic nature.   TODAY'S TREATMENT:  10/09/22: Pt performs AROM, gripping, and strength with Lt arm against therapist's resistance for exercise/activities as well as new measures today. OT also discusses home and functional tasks with the pt and reviews goals. Using the complied data, OT also reviews home exercises and provides updated/final recommendations and upgrades as below. Pt states understanding and tolerates upgrades well.  He was advised to think "crawl, walk, run" in terms of building up to strengthening and getting back into using this "Total Gym" and swimming routines.  He is agreeable to discharge today and feels confident being able to take care of himself at this point and work into strengthening without causing exacerbation.  He could always come back if needed in the future.  Exercises - Tricep Stretch- DO SEATED BY TABLE  - 3-4 x daily - 3-5 reps - 15 hold - Forearm Pronation Stretch  - 3-4 x daily - 3-5 reps - 15 sec hold - Seated Wrist Flexion Stretch  - 3-4 x daily - 3 reps - 10 second  hold - Wrist Extension Stretch Pronated  - 4 x daily - 3-5 reps - 15 hold - Wrist Extension with Resistance  - 2-4 x daily - 1-2 sets - 10-15 reps - Wrist Flexion with Resistance  - 2-4 x daily - 1-2 sets - 10-15 reps - Hammer Stretch or Strength   - 2-4 x daily - 1-2 sets - 10-15 reps  PATIENT EDUCATION: Education details: See tx section above for details  Person educated: Patient Education method: Engineer, structural, Teach back, Handouts  Education comprehension: States and  demonstrates understanding, Additional Education required    HOME EXERCISE PROGRAM: Access Code: [email or web address] URL: [email or web address]    GOALS: Goals reviewed with patient? Yes   SHORT TERM GOALS: (STG required if POC>30 days) Target Date: 08/17/22   Pt will demo/state understanding of initial HEP to improve pain levels and prerequisite motion. Goal status: 08/21/22: MET   LONG TERM GOALS: Target Date: 09/21/22  Pt will improve functional ability by decreased impairment per PSFS assessment from 3 to 7 or better, for better quality of life. Goal status: 10/09/22: MET 8.5  2.  Pt will improve grip strength in Lt hand from 84lbs to at least 90lbs for functional use at  home and in IADLs. Goal status: 10/09/22: Considered Met now 88# no pain  3.  Pt will improve A/ROM in Lt FA pronation from 67* to at least 75*, to have functional motion for tasks like reach and grasp.  Goal status: 10/09/22: MET 77* now  4.  Pt will improve strength in Lt wrist ext and supination from 4/5 MMT to at least 4+/5 MMT to have increased functional ability to carry out selfcare and higher-level homecare tasks with no difficulty. Goal status: 10/09/22: MET   5.  Pt will decrease pain at rest from 2/10 to 0/10 or better consistently to have better sleep and occupational participation in daily roles. Goal status: 10/09/22: MET    ASSESSMENT:  CLINICAL IMPRESSION: 10/09/22: Despite not coming into therapy for 6 weeks he was able to maintain his gains and continue to heal using therapy recommendations.  He now has no pain and no significant functional problems.  He was educated on how to "crawl, walk, run" in terms of building up strength and getting back into all activities at this point.  He feels confident doing this on his own and we will discharge therapy today.  All goals met  PLAN:  OT FREQUENCY: & OT DURATION: 1 more visit from 09/20/21 to 10/09/22, to cover today's last visit, then he is  discharged.   PLANNED INTERVENTIONS: self care/ADL training, therapeutic exercise, therapeutic activity, neuromuscular re-education, manual therapy, passive range of motion, splinting, electrical stimulation, ultrasound, compression bandaging, moist heat, cryotherapy, patient/family education, energy conservation, coping strategies training, and Dry needling  CONSULTED AND AGREED WITH PLAN OF CARE: Patient  PLAN FOR NEXT SESSION:  N/A D/C   Fannie Knee, OTR/L, CHT 10/09/2022, 10:17 AM   OCCUPATIONAL THERAPY DISCHARGE SUMMARY  Visits from Start of Care: 3  Current functional level related to goals / functional outcomes: Pt has met all goals to satisfactory levels and is pleased with outcomes.   Remaining deficits: Pt has no more significant functional deficits or pain.   Education / Equipment: Pt has all needed materials and education. Pt understands how to continue on with self-management. See tx notes for more details.   Patient agrees to discharge due to max benefits received from outpatient occupational therapy / hand therapy at this time.   Fannie Knee, OTR/L, CHT 10/09/22

## 2022-10-09 ENCOUNTER — Ambulatory Visit (INDEPENDENT_AMBULATORY_CARE_PROVIDER_SITE_OTHER): Payer: Commercial Managed Care - PPO | Admitting: Rehabilitative and Restorative Service Providers"

## 2022-10-09 ENCOUNTER — Encounter: Payer: Self-pay | Admitting: Rehabilitative and Restorative Service Providers"

## 2022-10-09 DIAGNOSIS — G8929 Other chronic pain: Secondary | ICD-10-CM

## 2022-10-09 DIAGNOSIS — M25632 Stiffness of left wrist, not elsewhere classified: Secondary | ICD-10-CM | POA: Diagnosis not present

## 2022-10-09 DIAGNOSIS — M25522 Pain in left elbow: Secondary | ICD-10-CM

## 2022-10-09 DIAGNOSIS — M7712 Lateral epicondylitis, left elbow: Secondary | ICD-10-CM

## 2022-10-09 DIAGNOSIS — M6281 Muscle weakness (generalized): Secondary | ICD-10-CM | POA: Diagnosis not present

## 2022-10-16 ENCOUNTER — Encounter: Payer: Commercial Managed Care - PPO | Admitting: Rehabilitative and Restorative Service Providers"

## 2022-12-13 ENCOUNTER — Ambulatory Visit (INDEPENDENT_AMBULATORY_CARE_PROVIDER_SITE_OTHER): Payer: Commercial Managed Care - PPO | Admitting: Family

## 2022-12-13 ENCOUNTER — Encounter: Payer: Self-pay | Admitting: Family

## 2022-12-13 VITALS — BP 118/70 | HR 52 | Temp 97.5°F | Ht 70.0 in | Wt 189.0 lb

## 2022-12-13 DIAGNOSIS — L237 Allergic contact dermatitis due to plants, except food: Secondary | ICD-10-CM

## 2022-12-13 MED ORDER — METHYLPREDNISOLONE ACETATE 80 MG/ML IJ SUSP
80.0000 mg | Freq: Once | INTRAMUSCULAR | Status: AC
Start: 2022-12-13 — End: 2022-12-13
  Administered 2022-12-13: 80 mg via INTRAMUSCULAR

## 2022-12-13 MED ORDER — PREDNISONE 20 MG PO TABS
ORAL_TABLET | ORAL | 0 refills | Status: DC
Start: 2022-12-14 — End: 2023-07-09

## 2022-12-13 MED ORDER — TRIAMCINOLONE ACETONIDE 0.1 % EX CREA
1.0000 | TOPICAL_CREAM | Freq: Two times a day (BID) | CUTANEOUS | 0 refills | Status: DC
Start: 2022-12-13 — End: 2023-07-09

## 2022-12-13 MED ORDER — HYDROXYZINE HCL 10 MG PO TABS
10.0000 mg | ORAL_TABLET | Freq: Three times a day (TID) | ORAL | 0 refills | Status: DC | PRN
Start: 2022-12-13 — End: 2023-07-09

## 2022-12-13 NOTE — Patient Instructions (Signed)
It was very nice to see you today!   We gave you a steroid injection today to help stop the rash from spreading. I have also sent over prednisone to start tomorrow and Hydroxyzine to help with the itching and a steroid cream for the itchiness. See below for other ideas.  Poison Ivy/Oak/Sumak Treatment  Apply cool, wet compresses of plain water OR topical Burow's Solution (aluminum acetate) or [place]omeboro several times daily for 30 minutes, allowing water to evaporate slowly.    As an alternative, or in conjunction with wet compresses, use calamine lotion. Calamine lotion containing pramoxine (an ingredient that numbs irritated skin and helps with itching) is Aveeno Anti-Itch Lotion.    May also take oral antihistamines (Claritin, Allegra, Zyrtec) daily to help with itching. If itching is bothersome at night and causing lack of sleep, may try Benadryl.     PLEASE NOTE:  If you had any lab tests please let us know if you have not heard back within a few days. You may see your results on MyChart before we have a chance to review them but we will give you a call once they are reviewed by [place]. If we ordered any referrals today, please let us know if you have not heard from their office within the next week.

## 2022-12-13 NOTE — Progress Notes (Signed)
Patient ID: Nathaniel Stevens, male    DOB: Dec 07, 1955, 67 y.o.   MRN: [id number]  Chief Complaint  Patient presents with   Poison Ivy    Possible poison ivy noticed 4 days ago spreading more eyes swollen x 2 days.     HPI:      Skin rash:  pt [place] and did not recognize the poison ivy plant, not wearing long sleeves or pants. Reports rash on his chest, legs, arms, face, swollen eyelids, and fingers. Very itchy, hive-like lesions.   Assessment & Plan:  1. Poison ivy dermatitis - given steroid inj in office, sending hydroxyzine and prednisone and steroid cream to pharmacy, advised on use & SE of all meds.  - methylPREDNISolone acetate (DEPO-MEDROL) injection 80 mg - hydrOXYzine (ATARAX) 10 MG tablet; Take 1-2 tablets (10-20 mg total) by mouth 3 (three) times daily as needed for itching.  Dispense: 30 tablet; Refill: 0 - predniSONE (DELTASONE) 20 MG tablet; START tomorrow. Take 2 pills in the morning with breakfast for 3 days, then 1 pill for 2 days  Dispense: 8 tablet; Refill: 0 - triamcinolone cream (KENALOG) 0.1 %; Apply 1 Application topically 2 (two) times daily. Use on all itchy rash areas. Be careful on eyelids, just a thin layer.  Dispense: 45 g; Refill: 0   Subjective:    Outpatient Medications Prior to Visit  Medication Sig Dispense Refill   Ferrous Sulfate (IRON PO) Take 1 tablet by mouth daily in the afternoon.     Multiple Vitamin (MULTIVITAMIN WITH MINERALS) TABS tablet Take 1 tablet by mouth daily.     triamcinolone cream (KENALOG) 0.1 % Apply 1 application. topically 2 (two) times daily as needed. (Patient not taking: Reported on 08/07/2022) 80 g 3   No facility-administered medications prior to visit.   Past Medical History:  Diagnosis Date   Arthritis 2014   Blood transfusion without reported diagnosis    Heart murmur 1958   Hepatitis B    due to transfusion   Hepatitis C    due to transfusion   History of chicken pox     Past Surgical History:  Procedure Laterality Date   EYE SURGERY  1999   JOINT REPLACEMENT Right 2012   Rt Knee   KIDNEY SURGERY Left 1981   SHOULDER SURGERY Right 1999   wrist and hand surgery Right 2002   No Known Allergies    Objective:    Physical Exam Vitals and nursing note reviewed.  Constitutional:      General: He is not in acute distress.    Appearance: Normal appearance.  HENT:     Head: Normocephalic.  Eyes:     Extraocular Movements: Extraocular movements intact.     Comments: Bilateral upper eyelids swollen, mildly pink in color  Cardiovascular:     Rate and Rhythm: Normal rate and regular rhythm.  Pulmonary:     Effort: Pulmonary effort is normal.     Breath sounds: Normal breath sounds.  Musculoskeletal:        General: Normal range of motion.     Cervical back: Normal range of motion.  Skin:    General: Skin is warm and dry.     Findings: Rash (pink hive-like bumps on bilateral arms, legs, chest, abodomen, hands/fingers) present. Rash is urticarial.  Neurological:     Mental Status: He is alert and oriented to person, place, and time.  Psychiatric:  Mood and Affect: Mood normal.    BP 118/70 (BP Location: Right Arm, Patient Position: Sitting, Cuff Size: Normal)   Pulse (!) 52   Temp (!) 97.5 F (36.4 C) (Temporal)   Ht 5\' 10"  (1.778 m)   Wt 189 lb (85.7 kg)   SpO2 98%   BMI 27.12 kg/m  Wt Readings from Last 3 Encounters:  12/13/22 189 lb (85.7 kg)  06/21/22 194 lb (88 kg)  06/07/22 191 lb 8 oz (86.9 kg)       Dulce Sellar, NP

## 2023-06-27 ENCOUNTER — Encounter: Payer: Commercial Managed Care - PPO | Admitting: Physician Assistant

## 2023-07-08 NOTE — Progress Notes (Signed)
Subjective:    Nathaniel Stevens is a 68 y.o. male and is here for a comprehensive physical exam.  HPI  Health Maintenance Due  Topic Date Due   Colonoscopy  04/04/2022    Acute Concerns: Sore Ankle  He complained of sore inner ankle in the right leg. He states that it was resolved by not moving around much. Can't seem to identify any injuries or possible causes.  Ankle seems somewhat swollen.  He denies wrapping his ankle or using any OTC medications.   Respiratory Infection  He recalls being sick when travelling last week. He believed it was RSV.   He experienced coughs along with chills and headaches. Denies any fevers at that time. He tried mucenx without much relief.  Covid test was negative.  Denies any chest pain, SOB, leg swelling, GI symptoms or coughing at night.    Chronic Issues: None   Health Maintenance: Immunizations -- flu  Colonoscopy -- Last done on 05-16-2017. One polyp. Recall in 10 years -- we do not have records of this -- he reports that he has this at home. PSA --  Lab Results  Component Value Date   PSA 0.95 06/07/2022   PSA 0.89 05/02/2021   PSA 0.85 04/04/2020   Diet -- typically healthy, balanced diet Sleep habits -- no complains Exercise -- normal exercise, elliptical  Weight --  Recent weight history Wt Readings from Last 10 Encounters:  07/09/23 187 lb 6.1 oz (85 kg)  12/13/22 189 lb (85.7 kg)  06/21/22 194 lb (88 kg)  06/07/22 191 lb 8 oz (86.9 kg)  05/02/21 187 lb 8 oz (85 kg)  08/03/20 192 lb 6.4 oz (87.3 kg)  04/04/20 183 lb (83 kg)  05/28/19 181 lb 12.8 oz (82.5 kg)  05/25/19 181 lb 8 oz (82.3 kg)   Body mass index is 26.89 kg/m.  Mood -- stable Alcohol use --  reports current alcohol use of about 4.0 standard drinks of alcohol per week.  Tobacco use --  Tobacco Use: Medium Risk (07/09/2023)   Patient History    Smoking Tobacco Use: Former    Smokeless Tobacco Use: Never    Passive Exposure: Not on file     Eligible for Low Dose CT? no  UTD with eye doctor? yes UTD with dentist? yes     07/09/2023    8:25 AM  Depression screen PHQ 2/9  Decreased Interest 0  Down, Depressed, Hopeless 0  PHQ - 2 Score 0    Other providers/specialists: Patient Care Team: Jarold Motto, [place] as PCP - General (Physician Assistant) Glyn Ade, PA-C as Physician Assistant (Dermatology)    PMHx, SurgHx, SocialHx, Medications, and Allergies were reviewed in the Visit Navigator and updated as appropriate.   Past Medical History:  Diagnosis Date   Arthritis 2014   Blood transfusion without reported diagnosis    Heart murmur 1958   Hepatitis B    due to transfusion   Hepatitis C    due to transfusion   History of chicken pox      Past Surgical History:  Procedure Laterality Date   EYE SURGERY  1999   JOINT REPLACEMENT Right 2012   Rt Knee   KIDNEY SURGERY Left 1981   SHOULDER SURGERY Right 1999   wrist and hand surgery Right 2002     Family History  Problem Relation Age of Onset   Melanoma Mother    Hypertension Father    Hyperlipidemia Father    Other  Father        Exposed to Agent Orange   Cirrhosis Father    Heart attack Sister 51   Dementia Maternal Grandmother    CVA Maternal Grandmother    Cancer Neg Hx     Social History   Tobacco Use   Smoking status: Former    Current packs/day: 0.00    Types: Cigarettes    Quit date: 08/03/1977    Years since quitting: 45.9   Smokeless tobacco: Never   Tobacco comments:    as a teen, has not smoked in 40 yrs  Vaping Use   Vaping status: Never Used  Substance Use Topics   Alcohol use: Yes    Alcohol/week: 4.0 standard drinks of alcohol    Types: 4 Glasses of wine per week    Comment: occasionally   Drug use: Never    Review of Systems:   Review of Systems  Constitutional:  Positive for chills. Negative for fever, malaise/fatigue and weight loss.  HENT:  Positive for sinus pain. Negative for hearing loss and sore  throat.   Respiratory:  Positive for cough. Negative for hemoptysis and shortness of breath.   Cardiovascular:  Negative for chest pain, palpitations, leg swelling and PND.  Gastrointestinal: Negative.  Negative for abdominal pain, constipation, diarrhea, heartburn, nausea and vomiting.  Genitourinary:  Negative for dysuria, frequency and urgency.       +Urinary hesitancy   Musculoskeletal:  Positive for joint pain. Negative for back pain, myalgias and neck pain.  Skin:  Negative for itching and rash.  Neurological:  Negative for dizziness, tingling, seizures and headaches.  Endo/Heme/Allergies:  Negative for polydipsia.  Psychiatric/Behavioral:  Negative for depression. The patient is not nervous/anxious.     Objective:    Vitals:   07/09/23 0820  BP: 110/80  Pulse: (!) 57  Temp: (!) 97.2 F (36.2 C)  SpO2: 98%    Body mass index is 26.89 kg/m.  General  Alert, cooperative, no distress, appears stated age  Head:  Normocephalic, without obvious abnormality, atraumatic  Eyes:  PERRL, conjunctiva/corneas clear, EOM's intact, fundi benign, both eyes       Ears:  Normal TM's and external ear canals, both ears  Nose: Nares normal, septum midline, mucosa normal, no drainage or sinus tenderness  Throat: Lips, mucosa, and tongue normal; teeth and gums normal  Neck: Supple, symmetrical, trachea midline, no adenopathy;     thyroid:  No enlargement/tenderness/nodules; no carotid bruit or JVD  Back:   Symmetric, no curvature, ROM normal, no CVA tenderness  Lungs:   Clear to auscultation bilaterally, respirations unlabored  Chest wall:  No tenderness or deformity  Heart:  Regular rate and rhythm, S1 and S2 normal, no murmur, rub or gallop  Abdomen:   Soft, non-tender, bowel sounds active all four quadrants, no masses, no organomegaly  Extremities: Extremities normal, atraumatic, no cyanosis or edema  Prostate : Deferred   Skin: Skin color, texture, turgor normal, no rashes or lesions   Lymph nodes: Cervical, supraclavicular, and axillary nodes normal  Neurologic: CNII-XII grossly intact. Normal strength, sensation and reflexes throughout   AssessmentPlan:   Routine physical examination Today patient counseled on age appropriate routine health concerns for screening and prevention, each reviewed and up to date or declined. Immunizations reviewed and up to date or declined. Labs ordered and reviewed. Risk factors for depression reviewed and negative. Hearing function and visual acuity are intact. ADLs screened and addressed as needed. Functional ability and level of safety  reviewed and appropriate. Education, counseling and referrals performed based on assessed risks today. Patient provided with a copy of personalized plan for preventive services.  Prostate cancer screening Update today  Acute right ankle pain Overall improved / resolved Unclear etiology  He will let us know if symptom(s) return while he returns to activity  Acute cough Exam benign Suspect post-viral cough Continue supportive care If symptom(s) persist, could consider oral prednisone  Need for immunization against influenza Completed today    Jarold Motto, PA-C [place] [facility] M Kadhim,acting as a Neurosurgeon for [employer], PA.,have documented all relevant documentation on the behalf of Jarold Motto, PA,as directed by  Jarold Motto, PA while in the presence of Jarold Motto, [place].   I, Jarold Motto, [place], have reviewed all documentation for this visit. The documentation on 07/09/23 for the exam, diagnosis, procedures, and orders are all accurate and complete.

## 2023-07-09 ENCOUNTER — Encounter: Payer: Self-pay | Admitting: Physician Assistant

## 2023-07-09 ENCOUNTER — Ambulatory Visit (INDEPENDENT_AMBULATORY_CARE_PROVIDER_SITE_OTHER): Payer: Commercial Managed Care - PPO | Admitting: Physician Assistant

## 2023-07-09 VITALS — BP 110/80 | HR 57 | Temp 97.2°F | Ht 70.0 in | Wt 187.4 lb

## 2023-07-09 DIAGNOSIS — Z125 Encounter for screening for malignant neoplasm of prostate: Secondary | ICD-10-CM

## 2023-07-09 DIAGNOSIS — M25571 Pain in right ankle and joints of right foot: Secondary | ICD-10-CM | POA: Diagnosis not present

## 2023-07-09 DIAGNOSIS — Z0001 Encounter for general adult medical examination with abnormal findings: Secondary | ICD-10-CM | POA: Diagnosis not present

## 2023-07-09 DIAGNOSIS — Z23 Encounter for immunization: Secondary | ICD-10-CM

## 2023-07-09 DIAGNOSIS — Z0000 Encounter for general adult medical examination without abnormal findings: Secondary | ICD-10-CM

## 2023-07-09 DIAGNOSIS — R051 Acute cough: Secondary | ICD-10-CM | POA: Diagnosis not present

## 2023-07-09 LAB — CBC WITH DIFFERENTIAL/PLATELET
Basophils Absolute: 0 10*3/uL (ref 0.0–0.1)
Basophils Relative: 0.5 % (ref 0.0–3.0)
Eosinophils Absolute: 0.2 10*3/uL (ref 0.0–0.7)
Eosinophils Relative: 2.9 % (ref 0.0–5.0)
HCT: 46.9 % (ref 39.0–52.0)
Hemoglobin: 16 g/dL (ref 13.0–17.0)
Lymphocytes Relative: 22.6 % (ref 12.0–46.0)
Lymphs Abs: 1.4 10*3/uL (ref 0.7–4.0)
MCHC: 34.1 g/dL (ref 30.0–36.0)
MCV: 92.2 fL (ref 78.0–100.0)
Monocytes Absolute: 0.7 10*3/uL (ref 0.1–1.0)
Monocytes Relative: 10.9 % (ref 3.0–12.0)
Neutro Abs: 3.9 10*3/uL (ref 1.4–7.7)
Neutrophils Relative %: 63.1 % (ref 43.0–77.0)
Platelets: 372 10*3/uL (ref 150.0–400.0)
RBC: 5.09 Mil/uL (ref 4.22–5.81)
RDW: 12.7 % (ref 11.5–15.5)
WBC: 6.1 10*3/uL (ref 4.0–10.5)

## 2023-07-09 LAB — LIPID PANEL
Cholesterol: 207 mg/dL — ABNORMAL HIGH (ref 0–200)
HDL: 38.9 mg/dL — ABNORMAL LOW (ref >39.00)
LDL Cholesterol: 140 mg/dL — ABNORMAL HIGH (ref 0–99)
NonHDL: 168.41
Total CHOL/HDL Ratio: 5
Triglycerides: 141 mg/dL (ref 0.0–149.0)
VLDL: 28.2 mg/dL (ref 0.0–40.0)

## 2023-07-09 LAB — COMPREHENSIVE METABOLIC PANEL
ALT: 16 U/L (ref 0–53)
AST: 18 U/L (ref 0–37)
Albumin: 4.2 g/dL (ref 3.5–5.2)
Alkaline Phosphatase: 55 U/L (ref 39–117)
BUN: 8 mg/dL (ref 6–23)
CO2: 28 meq/L (ref 19–32)
Calcium: 9 mg/dL (ref 8.4–10.5)
Chloride: 104 meq/L (ref 96–112)
Creatinine, Ser: 0.85 mg/dL (ref 0.40–1.50)
GFR: 90.54 mL/min (ref >60.00)
Glucose, Bld: 90 mg/dL (ref 70–99)
Potassium: 4.4 meq/L (ref 3.5–5.1)
Sodium: 140 meq/L (ref 135–145)
Total Bilirubin: 0.7 mg/dL (ref 0.2–1.2)
Total Protein: 6.9 g/dL (ref 6.0–8.3)

## 2023-07-09 LAB — PSA: PSA: 1 ng/mL (ref 0.10–4.00)

## 2023-07-09 NOTE — Patient Instructions (Signed)
It was great to see you!  Please return the Record Release with information to get your colonoscopy or bring [place] your report if you find it at home If ankle pain returns, let me know If cough does not continue to improve, let me know   Please go to the lab for blood work.   Our office will call you with your results unless you have chosen to receive results via MyChart.  If your blood work is normal we will follow-up each year for physicals and as scheduled for chronic medical problems.  If anything is abnormal we will treat accordingly and get you in for a follow-up.  Take care,  Lelon Mast

## 2023-07-10 ENCOUNTER — Encounter: Payer: Self-pay | Admitting: Physician Assistant

## 2023-07-10 NOTE — Telephone Encounter (Signed)
Here is pt's Colonoscopy done 05/16/2017. Does he need repeat in 7 or 10 years due to polyps?

## 2023-10-14 ENCOUNTER — Encounter: Payer: Self-pay | Admitting: Physician Assistant

## 2023-10-14 ENCOUNTER — Ambulatory Visit (INDEPENDENT_AMBULATORY_CARE_PROVIDER_SITE_OTHER): Admitting: Physician Assistant

## 2023-10-14 VITALS — BP 120/80 | HR 56 | Temp 97.7°F | Ht 70.0 in | Wt 192.4 lb

## 2023-10-14 DIAGNOSIS — B079 Viral wart, unspecified: Secondary | ICD-10-CM

## 2023-10-14 NOTE — Progress Notes (Signed)
 Nathaniel Stevens is a 68 y.o. male here for a new problem.  History of Present Illness:   Chief Complaint  Patient presents with   Planter warts    Pt would like to have planter warts removed on left hand.   Warts: Pt complains of 2 warts on his left hand. He initially believed these warts were blisters.  He has not yet tried any OTC medications, stating he did not think they would work.  He is interested in wart removal and has had a wart removed from his left hand in the past, but notes it was not removed with cryotherapy.   Past Medical History:  Diagnosis Date   Arthritis 2014   Blood transfusion without reported diagnosis    Heart murmur 1958   Hepatitis B    due to transfusion   Hepatitis C    due to transfusion   History of chicken pox      Social History   Tobacco Use   Smoking status: Former    Current packs/day: 0.00    Types: Cigarettes    Quit date: 08/03/1977    Years since quitting: 46.2   Smokeless tobacco: Never   Tobacco comments:    as a teen, has not smoked in 40 yrs  Vaping Use   Vaping status: Never Used  Substance Use Topics   Alcohol use: Yes    Alcohol/week: 4.0 standard drinks of alcohol    Types: 4 Glasses of wine per week    Comment: occasionally   Drug use: Never    Past Surgical History:  Procedure Laterality Date   EYE SURGERY  1999   JOINT REPLACEMENT Right 2012   Rt Knee   KIDNEY SURGERY Left 1981   SHOULDER SURGERY Right 1999   wrist and hand surgery Right 2002    Family History  Problem Relation Age of Onset   Melanoma Mother    Hypertension Father    Hyperlipidemia Father    Other Father        Exposed to Agent Orange   Cirrhosis Father    Heart attack Sister 31   Dementia Maternal Grandmother    CVA Maternal Grandmother    Cancer Neg Hx     No Known Allergies  Current Medications:   Current Outpatient Medications:    Ferrous Sulfate (IRON PO), Take 1 tablet by mouth. Taking 2-3 times a week, Disp: , Rfl:     Multiple Vitamin (MULTIVITAMIN WITH MINERALS) TABS tablet, Take 1 tablet by mouth. Takes 2-3 times a week, Disp: , Rfl:    Review of Systems:   Negative unless otherwise specified per HPI.  Vitals:   Vitals:   10/14/23 0811  BP: 120/80  Pulse: (!) 56  Temp: 97.7 F (36.5 C)  TempSrc: Temporal  SpO2: 97%  Weight: 192 lb 6.1 oz (87.3 kg)  Height: 5\' 10"  (1.778 m)     Body mass index is 27.6 kg/m.  Physical Exam:   Physical Exam Vitals and nursing note reviewed.  Constitutional:      Appearance: He is well-developed.  HENT:     Head: Normocephalic.  Eyes:     Conjunctiva/sclera: Conjunctivae normal.     Pupils: Pupils are equal, round, and reactive to light.  Pulmonary:     Effort: Pulmonary effort is normal.  Musculoskeletal:        General: Normal range of motion.     Cervical back: Normal range of motion.  Skin:    General:  Skin is warm and dry.     Comments: Right hand: Wart to central aspect to palm Cluster of 3 warts to base of 5th finger  Neurological:     Mental Status: He is alert and oriented to person, place, and time.  Psychiatric:        Behavior: Behavior normal.        Thought Content: Thought content normal.        Judgment: Judgment normal.    Procedure: Cryotherapy  Consent:  Risks and benefits of therapy discussed with patient who voices understanding and agrees with planned care. No barriers to communication or understanding identified.  After obtaining informed consent, the patient's identity, procedure, and site were verified during a pause prior to proceeding with the minor surgical procedure as per universal protocol recommendations.    Meds, vitals, and allergies reviewed.  After appropriate cleansing, liquid nitrogen was applied to 4 warts on patient's right hand. Patient tolerated procedure well without any complications. Curettage also completed after two rounds of liquid cryo.     Assessment and Plan:   1. Viral warts,  unspecified type (Primary)  Aftercare, including blister formation, risks of bleeding, and risks of recurrence were discussed. All questions answered.  Return for retreatment as needed for further evaluation and management.   I, Bernita Bristle, acting as a Neurosurgeon for Alexander Iba, [place]., have documented all relevant documentation on the behalf of Alexander Iba, [place], as directed by  Alexander Iba, PA while in the presence of Alexander Iba, [place].  I, Alexander Iba, [place], have reviewed all documentation for this visit. The documentation on 10/14/23 for the exam, diagnosis, procedures, and orders are all accurate and complete.  Alexander Iba, PA-C

## 2023-11-06 ENCOUNTER — Ambulatory Visit: Admitting: Physician Assistant

## 2023-11-06 NOTE — Progress Notes (Signed)
 Nathaniel Stevens is a 68 y.o. male here for a new problem.  History of Present Illness:   Chief Complaint  Patient presents with   Warts    Pt c/o warts on his left hand, was Tx on 4/28, but have come back.   Foot Pain    Pt c/o right foot pain and ankle pain , x 9 months. Having pain with any weight bearing, and with movement. Has applied ice.    Palmar warts:  Pt complains of palmar warts on his left hand.  Previously received cryotherapy on 10/14/23.  These warts reappeared despite his efforts with shaving and filing using a pumice stone and an X-acto knife. He requests another treatment with cryotherapy today.   Foot pain: Pt complains of right foot and inner ankle pain at the arch and ankle, ongoing about 9 months.  His foot pain/soreness starts at arch and radiates up his foot.  He describes the pain as someone crushing his foot.  Reports some toe numbness in digits 3,4, and 5, on his right foot.  Onset of pain is typically about 1 hr after any weight bearing activity including walking or standing.  Has tried new shoes and alternates between 3 different pairs.  Has been wearing custom made orthotics that are 82+ years old.  His pain has limited his ability to run; stopped running and now only walks and uses the elliptical. Wears his orthotics even while on the elliptical.  Denies any foot swelling, bruising, bleeding, or any tingling in his toes.    Past Medical History:  Diagnosis Date   Arthritis 2014   Blood transfusion without reported diagnosis    Heart murmur 1958   Hepatitis B    due to transfusion   Hepatitis C    due to transfusion   History of chicken pox      Social History   Tobacco Use   Smoking status: Former    Current packs/day: 0.00    Types: Cigarettes    Quit date: 08/03/1977    Years since quitting: 46.2   Smokeless tobacco: Never   Tobacco comments:    as a teen, has not smoked in 40 yrs  Vaping Use   Vaping status: Never Used  Substance  Use Topics   Alcohol use: Yes    Alcohol/week: 4.0 standard drinks of alcohol    Types: 4 Glasses of wine per week    Comment: occasionally   Drug use: Never    Past Surgical History:  Procedure Laterality Date   EYE SURGERY  1999   JOINT REPLACEMENT Right 2012   Rt Knee   KIDNEY SURGERY Left 1981   SHOULDER SURGERY Right 1999   wrist and hand surgery Right 2002    Family History  Problem Relation Age of Onset   Melanoma Mother    Hypertension Father    Hyperlipidemia Father    Other Father        Exposed to Agent Orange   Cirrhosis Father    Heart attack Sister 42   Dementia Maternal Grandmother    CVA Maternal Grandmother    Cancer Neg Hx     No Known Allergies  Current Medications:   Current Outpatient Medications:    Ferrous Sulfate (IRON PO), Take 1 tablet by mouth. Taking 2-3 times a week, Disp: , Rfl:    Multiple Vitamin (MULTIVITAMIN WITH MINERALS) TABS tablet, Take 1 tablet by mouth. Takes 2-3 times a week, Disp: , Rfl:  Review of Systems:   Negative unless otherwise specified per HPI.  Vitals:   Vitals:   11/07/23 0817  BP: 128/72  Pulse: (!) 54  Temp: (!) 97.5 F (36.4 C)  TempSrc: Temporal  SpO2: 98%  Weight: 191 lb 6.1 oz (86.8 kg)  Height: 5\' 10"  (1.778 m)     Body mass index is 27.46 kg/m.  Physical Exam:   Physical Exam Vitals and nursing note reviewed.  Constitutional:      General: He is not in acute distress.    Appearance: He is well-developed. He is not ill-appearing or toxic-appearing.  HENT:     Head: Normocephalic.  Eyes:     Conjunctiva/sclera: Conjunctivae normal.     Pupils: Pupils are equal, round, and reactive to light.  Cardiovascular:     Rate and Rhythm: Normal rate and regular rhythm.     Pulses: Normal pulses.     Heart sounds: Normal heart sounds, S1 normal and S2 normal.  Pulmonary:     Effort: Pulmonary effort is normal.     Breath sounds: Normal breath sounds.  Musculoskeletal:        General:  Normal range of motion.     Cervical back: Normal range of motion.     Comments: Foot & Ankle: Overall foot and ankle are well aligned, no significant deformity No significant TTP over the base of the 5th metatarsal, navicular, or posterior aspect of medial or lateral malleolus No significant pain or discomfort with isolated passive forefoot abduction Inversion, eversion, dorsiflexion and plantar flexion strength 5+/5.   Skin:    General: Skin is warm and dry.     Comments: Wart to central aspect to palm Cluster of 3 warts to base of 5th finger   Neurological:     Mental Status: He is alert and oriented to person, place, and time.     GCS: GCS eye subscore is 4. GCS verbal subscore is 5. GCS motor subscore is 6.  Psychiatric:        Speech: Speech normal.        Behavior: Behavior normal. Behavior is cooperative.        Thought Content: Thought content normal.        Judgment: Judgment normal.    Procedure: Cryotherapy  Consent:  Risks and benefits of therapy discussed with patient who voices understanding and agrees with planned care. No barriers to communication or understanding identified.  After obtaining informed consent, the patient's identity, procedure, and site were verified during a pause prior to proceeding with the minor surgical procedure as per universal protocol recommendations.    Meds, vitals, and allergies reviewed.  After appropriate cleansing, liquid nitrogen was applied to warts on patient's right palm. Patient tolerated procedure well without any complications.   Assessment and Plan:   1. Right foot pain (Primary) - Ambulatory referral to Sports Medicine  Due to chronicity of symptom(s) and pain with weight bearing after approx 1 hour of activity, will refer to sports medicine [place] He also has old orthotics (approx 68 yo) and he would like to consider updating these   2. Viral warts, unspecified type - Ambulatory referral to Dermatology   Cryo  completed Will refer to dermatology for evaluation and management   I, Bernita Bristle, acting as a scribe for Alexander Iba, [place]., have documented all relevant documentation on the behalf of Alexander Iba, [place], as directed by   while in the presence of Alexander Iba, [place].  I, Alexander Iba,  PA, have reviewed all documentation for this visit. The documentation on 11/07/23 for the exam, diagnosis, procedures, and orders are all accurate and complete.  Alexander Iba, PA-C

## 2023-11-07 ENCOUNTER — Ambulatory Visit (INDEPENDENT_AMBULATORY_CARE_PROVIDER_SITE_OTHER): Admitting: Physician Assistant

## 2023-11-07 ENCOUNTER — Encounter: Payer: Self-pay | Admitting: Physician Assistant

## 2023-11-07 VITALS — BP 128/72 | HR 54 | Temp 97.5°F | Ht 70.0 in | Wt 191.4 lb

## 2023-11-07 DIAGNOSIS — M79671 Pain in right foot: Secondary | ICD-10-CM | POA: Diagnosis not present

## 2023-11-07 DIAGNOSIS — B079 Viral wart, unspecified: Secondary | ICD-10-CM | POA: Diagnosis not present

## 2023-11-12 ENCOUNTER — Ambulatory Visit
Admission: RE | Admit: 2023-11-12 | Discharge: 2023-11-12 | Disposition: A | Discharge status: Home / Self Care | Source: Ambulatory Visit | Attending: Family Medicine | Admitting: Family Medicine

## 2023-11-12 ENCOUNTER — Ambulatory Visit (INDEPENDENT_AMBULATORY_CARE_PROVIDER_SITE_OTHER): Admitting: Family Medicine

## 2023-11-12 ENCOUNTER — Encounter: Payer: Self-pay | Admitting: Family Medicine

## 2023-11-12 VITALS — BP 122/84 | Ht 70.0 in | Wt 185.0 lb

## 2023-11-12 DIAGNOSIS — M79671 Pain in right foot: Secondary | ICD-10-CM | POA: Diagnosis not present

## 2023-11-12 DIAGNOSIS — M25571 Pain in right ankle and joints of right foot: Secondary | ICD-10-CM | POA: Diagnosis not present

## 2023-11-12 MED ORDER — MELOXICAM 15 MG PO TABS: ORAL_TABLET | ORAL | 2 refills | Status: DC

## 2023-11-12 NOTE — Patient Instructions (Signed)
 Follow-up in 4 weeks

## 2023-11-12 NOTE — Progress Notes (Signed)
 PCP: Alexander Iba, PA  Chief Complaint: Right foot pain Subjective:   HPI: Patient is a 68 y.o. male here for right foot pain has been going on for the past 9 months.  Patient states that he has a spot of pain on his right foot near the plantar arch.  Patient states that it is tender whenever he puts pressure on it and usually walks.  Patient states that he has never had this pain before and it is started over the past 9 months and is totally certain worsen.  Patient does wear insoles in his shoes for history of plantar fasciitis.  Patient dates that he had plantar fasciitis in the past but states that this feels different.  Patient denies any injury or any other instability issues at this time.   Past Medical History:  Diagnosis Date   Arthritis 2014   Blood transfusion without reported diagnosis    Heart murmur 1958   Hepatitis B    due to transfusion   Hepatitis C    due to transfusion   History of chicken pox     Current Outpatient Medications on File Prior to Visit  Medication Sig Dispense Refill   Ferrous Sulfate (IRON PO) Take 1 tablet by mouth. Taking 2-3 times a week     Multiple Vitamin (MULTIVITAMIN WITH MINERALS) TABS tablet Take 1 tablet by mouth. Takes 2-3 times a week     No current facility-administered medications on file prior to visit.    Past Surgical History:  Procedure Laterality Date   EYE SURGERY  1999   JOINT REPLACEMENT Right 2012   Rt Knee   KIDNEY SURGERY Left 1981   SHOULDER SURGERY Right 1999   wrist and hand surgery Right 2002    No Known Allergies  BP 122/84 (BP Location: Left Arm, Patient Position: Sitting, Cuff Size: Normal)   Ht 5\' 10"  (1.778 m)   Wt 185 lb (83.9 kg)   BMI 26.54 kg/m       No data to display              No data to display              Objective:  Physical Exam:  Gen: NAD, comfortable in exam room  Inspection of the bilateral ankles reveals no gross abnormalities or swelling.  Inspection of the  bilateral feet shows high arches with some mild bony prominence noted near the right medial cuneiform and first metatarsal proximal joint, this is also noted on the left but not as prominent.  There is tenderness to palpation over the bony prominence of this area.  No tenderness over the plantar fascia area or the ATFL.  Normal inversion eversion testing.  Ankle drawer negative. Assessment & Plan:  1. 1. Acute right ankle pain (Primary) - Patient presenting for pinpoint ankle pain medial aspect of the midfoot.  Will go ahead and get x-rays though bony abnormality is likely cause of patient's pain.  I suspect patient's orthotics within irritating the affected area.  Will have patient stop using his orthotics at this time and we will give him green insoles to use.  Will also go ahead and prescribe patient meloxicam for the next 2 weeks to calm her down.  Will have patient continue to walk in the green insoles and follow-up in 4 weeks.  Will reevaluate at that time. - DG Ankle Complete Right; Future  2. Right foot pain - DG Foot Complete Right; Future  Binyomin Brann MD, PGY-4  Sports Medicine Fellow [facility]

## 2023-11-18 ENCOUNTER — Ambulatory Visit (HOSPITAL_BASED_OUTPATIENT_CLINIC_OR_DEPARTMENT_OTHER): Payer: Self-pay | Admitting: Family Medicine

## 2023-12-10 ENCOUNTER — Ambulatory Visit: Admitting: Family Medicine

## 2023-12-12 ENCOUNTER — Ambulatory Visit (INDEPENDENT_AMBULATORY_CARE_PROVIDER_SITE_OTHER): Admitting: Family Medicine

## 2023-12-12 ENCOUNTER — Encounter: Payer: Self-pay | Admitting: Family Medicine

## 2023-12-12 VITALS — BP 124/84 | Ht 70.0 in | Wt 185.0 lb

## 2023-12-12 DIAGNOSIS — M2141 Flat foot [pes planus] (acquired), right foot: Secondary | ICD-10-CM | POA: Diagnosis not present

## 2023-12-12 DIAGNOSIS — M79671 Pain in right foot: Secondary | ICD-10-CM | POA: Diagnosis not present

## 2023-12-12 DIAGNOSIS — M2142 Flat foot [pes planus] (acquired), left foot: Secondary | ICD-10-CM

## 2023-12-12 NOTE — Progress Notes (Signed)
 DATE OF VISIT: 12/12/2023        Nathaniel Stevens DOB: 06/11/1956 MRN: [id number]  CC:  f/u Rt ankle/foot  History of present Illness: Nathaniel Stevens is a 68 y.o. male who presents for a follow-up visit for Rt ankle Last seen 11/12/23 by Dr Vita in our office - was taken out of orthotics and given sports insoles - xrays Rt foot and ankle as noted below  Today reports has been improving Previous orthotics discontinued by Dr. Vita were rigid orthotics that were taken into the foot - He thinks these orthotics were 6 to 67 years old He has been using another set of orthotics that he had at home that are possibly 67+ years old, they are semirigid orthotics and more comfortable because they are more cushion No longer having pain Potentially interested in custom orthotics with us   Medications:  Outpatient Encounter Medications as of 12/12/2023  Medication Sig   Ferrous Sulfate (IRON PO) Take 1 tablet by mouth. Taking 2-3 times a week   meloxicam  (MOBIC ) 15 MG tablet Take one pill a day with food for 7 days and then prn thereafter   Multiple Vitamin (MULTIVITAMIN WITH MINERALS) TABS tablet Take 1 tablet by mouth. Takes 2-3 times a week   No facility-administered encounter medications on file as of 12/12/2023.    Allergies: has no known allergies.  Physical Examination: Vitals: BP 124/84   Ht 5' 10 (1.778 m)   Wt 185 lb (83.9 kg)   BMI 26.54 kg/m  GENERAL:  Lucus Stevens is a 68 y.o. male appearing their stated age, alert and oriented x 3, in no apparent distress.  SKIN: no rashes or lesions, skin clean, dry, intact MSK: Foot/ankle: Right foot and ankle without any soft tissue swelling.  No increased redness.  No bruising.  Has prominence of his navicular and small bunion.  No tenderness over the navicular, TMT joint, first MTP joint.  No hallux rigidus.  He does have flexible flatfoot and has overpronation with standing. Left foot with also slightly prominent navicular.  No tenderness to  palpation.  Also flexible flatfoot and slight overpronation with standing Walking without a limp Neurovascular intact distally  Radiology: Right ankle x-ray 3 views AP/lateral/mortise 11/12/2023 personally reviewed and interpreted by me today showing: -No acute fracture - Small plantar calcaneal heel spur is noted -Does have cortical irregularity along the distal fibula along the syndesmosis, no signs of acute fracture.  Likely related to prior injury/trauma  Right foot x-ray 3 views AP/lateral/oblique 11/12/2023 personally reviewed interpreted by me today showing: -First MTP joint with severe degenerative changes, associated peripheral osteophytes noted. - Prominence of the medial navicular, no bony irregularities.  No signs of os naviculare. - Scattered vascular calcifications noted  Assessment & Plan Right foot pain Right medial foot pain with associated pes planus and overpronation.  Does have prominence of the navicular.  Prior rigid orthotics likely pressing on the navicular.  Now improved with semirigid/cushioning orthotics that are 50+ years old  Plan: - Prior visit note with Dr. Vita was reviewed today -X-rays were reviewed as noted above - Patient would benefit from updated custom orthotics with this.  Showed him our orthotics and explained that they are semirigid and will provide more support and stability across to his foot and should not take him to the affected area.  Reviewed cost.  He is interested in scheduling appointment, he plans to get 2 sets at that visit.  He is aware of $190 percent cost -  He will continue his semirigid orthotics in the interim - He will reach out with symptoms if they are worsening Bilateral pes planus Bilateral pes planus with overpronation, would benefit from updated custom orthotics  Plan: - Patient would benefit from updated custom orthotics with this.  Showed him our orthotics and explained that they are semirigid and will provide more support  and stability across to his foot and should not take him to the affected area.  Reviewed cost.  He is interested in scheduling appointment, he plans to get 2 sets at that visit.  He is aware of $190 percent cost - He will continue his semirigid orthotics in the interim - He will reach out with symptoms if they are worsening   Patient expressed understanding & agreement with above.  Encounter Diagnoses  Name Primary?   Right foot pain Yes   Bilateral pes planus     No orders of the defined types were placed in this encounter.

## 2023-12-17 ENCOUNTER — Ambulatory Visit (INDEPENDENT_AMBULATORY_CARE_PROVIDER_SITE_OTHER): Admitting: Family Medicine

## 2023-12-17 VITALS — Ht 70.0 in | Wt 185.0 lb

## 2023-12-17 DIAGNOSIS — M79671 Pain in right foot: Secondary | ICD-10-CM | POA: Diagnosis not present

## 2023-12-17 DIAGNOSIS — M2142 Flat foot [pes planus] (acquired), left foot: Secondary | ICD-10-CM | POA: Diagnosis not present

## 2023-12-17 DIAGNOSIS — M2141 Flat foot [pes planus] (acquired), right foot: Secondary | ICD-10-CM | POA: Diagnosis not present

## 2023-12-17 NOTE — Patient Instructions (Signed)
 Thank you for coming to see me today. It was a pleasure.   We made you new orthotics. Let us know if we need to add any extra cushioning or make changes.  If you have any questions or concerns, please do not hesitate to call the office at [phone].

## 2023-12-17 NOTE — Progress Notes (Signed)
 DATE OF VISIT: 12/17/2023        Nathaniel Stevens DOB: 29-Mar-1956 MRN: [id number]  CC:  custom orthotics  History of present Illness: Nathaniel Stevens is a 68 y.o. male who presents for a follow-up visit for custom orthotics Last seen by me 12/12/23 for foot pain Hx of prior custom orthotics that are 62+ years old  Medications:  Outpatient Encounter Medications as of 12/17/2023  Medication Sig   Ferrous Sulfate (IRON PO) Take 1 tablet by mouth. Taking 2-3 times a week   meloxicam  (MOBIC ) 15 MG tablet Take one pill a day with food for 7 days and then prn thereafter   Multiple Vitamin (MULTIVITAMIN WITH MINERALS) TABS tablet Take 1 tablet by mouth. Takes 2-3 times a week   No facility-administered encounter medications on file as of 12/17/2023.    Allergies: has no known allergies.  Physical Examination: Vitals: There were no vitals taken for this visit. GENERAL:  Nathaniel Stevens is a 68 y.o. male appearing their stated age, alert and oriented x 3, in no apparent distress.  SKIN: no rashes or lesions, skin clean, dry, intact MSK: Foot/ankle: Bilateral foot ankle without any swelling, bruising, redness.  Prominence of his navicular and small bunion on the right.  Has flexible flatfoot and overpronation bilaterally.  Has 4th and 5th ray insufficiency bilaterally. Walking without a limp Neurovascular intact distally  Assessment & Plan  1. Bilateral pes planus 2. Right foot pain Bilateral pes planus with associated right foot pain, also has prominent navicular.  Prior rigid orthotics were likely pressing on the navicular.  Orthotics that he had previously are 58-72+ years old.  Candidate for updated orthotics today  Plan: - Custom orthotics fabricated in the office as noted below.  He request 2 sets of orthotics today  Patient was fitted for a : Fit 'n Run semi-rigid orthotic  The orthotic was heated, placed on the orthotic stand. The patient was positioned in subtalar neutral position and  10 degrees of ankle dorsiflexion in a weight bearing stance on the heated orthotic blank After completion of molding Blank: Fit 'n Run - size 11 -- (2) sets were fabricated today Posting:  none Base: built-in base - Fit and Run   Findings were comfortable in the office today and he had more neutral alignment and gait.  - He will follow-up with us  on an as-needed basis.  Will reach out with any questions or concerns  Patient expressed understanding & agreement with above.  No diagnosis found.  No orders of the defined types were placed in this encounter.

## 2024-03-17 ENCOUNTER — Other Ambulatory Visit: Payer: Self-pay

## 2024-03-17 ENCOUNTER — Encounter: Payer: Self-pay | Admitting: Physician Assistant

## 2024-03-17 DIAGNOSIS — Z1211 Encounter for screening for malignant neoplasm of colon: Secondary | ICD-10-CM

## 2024-03-17 MED ORDER — MELOXICAM 15 MG PO TABS: 15.0000 mg | ORAL_TABLET | Freq: Every day | ORAL | 0 refills | Status: DC | PRN

## 2024-03-19 ENCOUNTER — Ambulatory Visit (INDEPENDENT_AMBULATORY_CARE_PROVIDER_SITE_OTHER): Admitting: Family Medicine

## 2024-03-19 ENCOUNTER — Other Ambulatory Visit: Payer: Self-pay

## 2024-03-19 ENCOUNTER — Encounter: Payer: Self-pay | Admitting: Family Medicine

## 2024-03-19 VITALS — BP 136/84 | Ht 70.0 in | Wt 190.0 lb

## 2024-03-19 DIAGNOSIS — M2141 Flat foot [pes planus] (acquired), right foot: Secondary | ICD-10-CM | POA: Diagnosis not present

## 2024-03-19 DIAGNOSIS — M79671 Pain in right foot: Secondary | ICD-10-CM

## 2024-03-19 DIAGNOSIS — M2142 Flat foot [pes planus] (acquired), left foot: Secondary | ICD-10-CM | POA: Diagnosis not present

## 2024-03-19 NOTE — Progress Notes (Signed)
 DATE OF VISIT: 03/19/2024        Nathaniel Stevens DOB: June 13, 1956 MRN: [id number]  CC:  f/u Rt foot pain  History of present Illness: Nathaniel Stevens is a 68 y.o. male who presents for a follow-up visit for Rt foot pain Has been experiencing chronic right foot pain with associated pes planus Last seen by me 12/17/2023 when I fabricated 2 sets of custom orthotics for him Has been using these on a regular basis He notes some improvement of symptoms, but now starting to get some increasing pain along the medial aspect of his right arch Denies any change in activity Denies any injury or trauma Symptoms have been present for approximately 2 to 3 weeks Has been taking occasional meloxicam  for this, he just requested refill 2 days ago for this  Medications:  Outpatient Encounter Medications as of 03/19/2024  Medication Sig   Ferrous Sulfate (IRON PO) Take 1 tablet by mouth. Taking 2-3 times a week   meloxicam  (MOBIC ) 15 MG tablet Take 1 tablet (15 mg total) by mouth daily as needed for pain.   Multiple Vitamin (MULTIVITAMIN WITH MINERALS) TABS tablet Take 1 tablet by mouth. Takes 2-3 times a week   No facility-administered encounter medications on file as of 03/19/2024.    Allergies: has no known allergies.  Physical Examination: Vitals: BP 136/84   Ht 5' 10 (1.778 m)   Wt 190 lb (86.2 kg)   BMI 27.26 kg/m  GENERAL:  Nathaniel Stevens is a 68 y.o. male appearing their stated age, alert and oriented x 3, in no apparent distress.  SKIN: no rashes or lesions, skin clean, dry, intact MSK: Bilateral foot and ankle without any swelling or effusion.  No increased redness or warmth.  He has prominence of his navicular and bunion on the right.  Has minimal tenderness over navicular in this area.  He has bilateral flexible flatfoot and overpronation bilaterally, more prominent on the right compared to the left.  Ankle strength is 5/5 throughout.  Good great toe motion bilaterally. Walking without a  limp Neurovascular intact distally  Radiology: Limited MSK ultrasound right foot/ankle Date: 03/19/2024 Indication: Right foot/ankle pain Findings: - Posterior tibialis with thickening, slight hypoechoic change, no significant edema within the tendon sheath.  No increased Doppler flow.  No signs of tearing.  Well-visualized in short and long axis views, following distally to insertion - Normal-appearing flexor digitorum and flexor hallucis.  Normal-appearing neurovascular bundle along the medial ankle - Has mild tarsonavicular degenerative changes, no effusion - Mild degenerative changes at the navicular cuneiform articulation, no effusion - First TMT joint with no significant effusion  Impression: - Posterior tibialis tendinopathy without any signs of tearing - Mild midfoot degenerative changes, specifically around the navicular Images and interpretation completed by Rainell Cedar, DO    Assessment & Plan  1. Right foot pain 2. Bilateral pes planus Ongoing right foot pain with associated pes planus, pain most prominent along the right navicular today which is more noticeable.  Does have some mild degenerative changes in this area.  Plan: - MSK ultrasound completed as noted above, degenerative changes noted.  No other significant findings - 1st ray post with a blue Poron placed on right orthotic of both sets of his orthotics.  These were more comfortable in the office and he had less pressure along the medial foot. - He will continue use the orthotics with adjustment from today.  If continues have ongoing issues could consider additional padding along the right midfoot/navicular  area. -He can continue to use his meloxicam  as needed, refill was submitted 2 days ago - He can try over-the-counter Voltaren gel every 6-8 hours as needed - He will reach out if worsening or not improving, otherwise follow-up as needed   Patient expressed understanding & agreement with above.  Encounter  Diagnoses  Name Primary?   Right foot pain Yes   Bilateral pes planus     Orders Placed This Encounter  Procedures   US  LIMITED JOINT SPACE STRUCTURES LOW RIGHT

## 2024-04-06 ENCOUNTER — Encounter: Payer: Self-pay | Admitting: Pediatrics

## 2024-04-08 ENCOUNTER — Encounter

## 2024-04-09 ENCOUNTER — Ambulatory Visit (AMBULATORY_SURGERY_CENTER)

## 2024-04-09 ENCOUNTER — Other Ambulatory Visit: Payer: Self-pay

## 2024-04-09 VITALS — Ht 70.0 in | Wt 186.0 lb

## 2024-04-09 DIAGNOSIS — Z860100 Personal history of colon polyps, unspecified: Secondary | ICD-10-CM

## 2024-04-09 MED ORDER — NA SULFATE-K SULFATE-MG SULF 17.5-3.13-1.6 GM/177ML PO SOLN: 1.0000 | Freq: Once | ORAL | 0 refills | Status: AC

## 2024-04-09 NOTE — Progress Notes (Signed)
 Denies allergies to eggs or soy products. Denies complication of anesthesia or sedation. Denies use of weight loss medication. Denies use of O2.   Emmi instructions given for colonoscopy.

## 2024-04-10 ENCOUNTER — Encounter: Payer: Self-pay | Admitting: Pediatrics

## 2024-04-15 NOTE — Progress Notes (Unsigned)
 [facility] History and Physical   Primary Care Physician:  Job Lukes, [place]   Reason for Procedure:  Colorectal cancer screening  Plan:    Colonoscopy     HPI: Nathaniel Stevens is a 68 y.o. male undergoing colonoscopy for colorectal cancer screening.  Patient's last colonoscopy was performed in 2018 in [place] .  Report indicates that 2 rectal polyps were removed which were a hyperplastic polyp and lymphoid aggregate on pathology.  No family history of colorectal cancer or polyps.  Patient denies current symptoms of change in bowel habits or rectal bleeding.   Past Medical History:  Diagnosis Date   Arthritis 2014   Blood transfusion without reported diagnosis    Heart murmur 1958   Hepatitis B    due to transfusion   Hepatitis C    due to transfusion   History of chicken pox    Hyperlipidemia     Past Surgical History:  Procedure Laterality Date   EYE SURGERY  1999   JOINT REPLACEMENT Right 2012   Rt Knee   KIDNEY SURGERY Left 1981   SHOULDER SURGERY Right 1999   wrist and hand surgery Right 2002    Prior to Admission medications   Medication Sig Start Date End Date Taking? Authorizing Provider  Ferrous Sulfate (IRON PO) Take 1 tablet by mouth. Taking 2-3 times a week    [provider]  meloxicam  (MOBIC ) 15 MG tablet Take 1 tablet (15 mg total) by mouth daily as needed for pain. 03/17/24   Teressa Rainell BROCKS, DO  Multiple Vitamin (MULTIVITAMIN WITH MINERALS) TABS tablet Take 1 tablet by mouth. Takes 2-3 times a week    [provider]    Current Outpatient Medications  Medication Sig Dispense Refill   Ferrous Sulfate (IRON PO) Take 1 tablet by mouth. Taking 2-3 times a week     meloxicam  (MOBIC ) 15 MG tablet Take 1 tablet (15 mg total) by mouth daily as needed for pain. 30 tablet 0   Multiple Vitamin (MULTIVITAMIN WITH MINERALS) TABS tablet Take 1 tablet by mouth. Takes 2-3 times a week     No current facility-administered medications  for this visit.    Allergies as of 04/17/2024   (No Known Allergies)    Family History  Problem Relation Age of Onset   Melanoma Mother    Hypertension Father    Hyperlipidemia Father    Other Father        Exposed to Agent Orange   Cirrhosis Father    Heart attack Sister 60   Dementia Maternal Grandmother    CVA Maternal Grandmother    Cancer Neg Hx    Colon cancer Neg Hx    Esophageal cancer Neg Hx    Rectal cancer Neg Hx    Stomach cancer Neg Hx     Social History   Socioeconomic History   Marital status: Married    Spouse name: Not on file   Number of children: Not on file   Years of education: Not on file   Highest education level: Bachelor's degree (e.g., BA, AB, BS)  Occupational History   Not on file  Tobacco Use   Smoking status: Former    Current packs/day: 0.00    Types: Cigarettes    Quit date: 08/03/1977    Years since quitting: 46.7   Smokeless tobacco: Never   Tobacco comments:    as a teen, has not smoked in 40 yrs  Vaping Use   Vaping status: Never  Used  Substance and Sexual Activity   Alcohol use: Yes    Alcohol/week: 4.0 standard drinks of alcohol    Types: 4 Glasses of wine per week    Comment: occasionally   Drug use: Never   Sexual activity: Yes    Birth control/protection: None  Other Topics Concern   Not on file  Social History Narrative   P[employer]   Married   Social Drivers of Health   Financial Resource Strain: Low Risk  (11/05/2023)   Overall Financial Resource Strain (CARDIA)    Difficulty of Paying Living Expenses: Not hard at all  Food Insecurity: No Food Insecurity (11/05/2023)   Hunger Vital Sign    Worried About Running Out of Food in the Last Year: Never true    Ran Out of Food in the Last Year: Never true  Transportation Needs: No Transportation Needs (11/05/2023)   PRAPARE - Administrator, Civil Service (Medical): No    Lack of Transportation (Non-Medical): No  Physical Activity: Sufficiently Active  (11/05/2023)   Exercise Vital Sign    Days of Exercise per Week: 6 days    Minutes of Exercise per Session: 50 min  Stress: No Stress Concern Present (11/05/2023)   [employer] of Occupational Health - Occupational Stress Questionnaire    Feeling of Stress : Only a little  Social Connections: Moderately Isolated (11/05/2023)   Social Connection and Isolation Panel    Frequency of Communication with Friends and Family: Twice a week    Frequency of Social Gatherings with Friends and Family: Once a week    Attends Religious Services: Never    Database Administrator or Organizations: No    Attends Engineer, Structural: Not on file    Marital Status: Married  Catering Manager Violence: Not on file    Review of Systems:  All other review of systems negative except as mentioned in the HPI.  Physical Exam: Vital signs There were no vitals taken for this visit.  General:   Alert,  Well-developed, well-nourished, pleasant and cooperative in NAD Airway:  Mallampati  Lungs:  Clear throughout to auscultation.   Heart:  Regular rate and rhythm; no murmurs, clicks, rubs,  or gallops. Abdomen:  Soft, nontender and nondistended. Normal bowel sounds.   Neuro/Psych:  Normal mood and affect. A and O x 3  Inocente Hausen, MD [facility]

## 2024-04-17 ENCOUNTER — Encounter: Payer: Self-pay | Admitting: Pediatrics

## 2024-04-17 ENCOUNTER — Ambulatory Visit (AMBULATORY_SURGERY_CENTER): Admitting: Pediatrics

## 2024-04-17 VITALS — BP 117/67 | HR 47 | Temp 97.3°F | Resp 11 | Ht 70.0 in | Wt 186.0 lb

## 2024-04-17 DIAGNOSIS — K635 Polyp of colon: Secondary | ICD-10-CM

## 2024-04-17 DIAGNOSIS — Z860102 Personal history of hyperplastic colon polyps: Secondary | ICD-10-CM

## 2024-04-17 DIAGNOSIS — K648 Other hemorrhoids: Secondary | ICD-10-CM

## 2024-04-17 DIAGNOSIS — D120 Benign neoplasm of cecum: Secondary | ICD-10-CM

## 2024-04-17 DIAGNOSIS — D122 Benign neoplasm of ascending colon: Secondary | ICD-10-CM

## 2024-04-17 DIAGNOSIS — Z1211 Encounter for screening for malignant neoplasm of colon: Secondary | ICD-10-CM

## 2024-04-17 DIAGNOSIS — Z860100 Personal history of colon polyps, unspecified: Secondary | ICD-10-CM

## 2024-04-17 DIAGNOSIS — D125 Benign neoplasm of sigmoid colon: Secondary | ICD-10-CM

## 2024-04-17 MED ORDER — SODIUM CHLORIDE 0.9 % IV SOLN: 500.0000 mL | Freq: Once | INTRAVENOUS | Status: DC

## 2024-04-17 NOTE — Progress Notes (Signed)
 Called to room to assist during endoscopic procedure.  Patient ID and intended procedure confirmed with present staff. Received instructions for my participation in the procedure from the performing physician.

## 2024-04-17 NOTE — Progress Notes (Signed)
 Pt's states no medical or surgical changes since previsit or office visit.

## 2024-04-17 NOTE — Progress Notes (Signed)
 Vss nad trans to pacu

## 2024-04-17 NOTE — Op Note (Addendum)
 [facility] Patient Name: Nathaniel Stevens Procedure Date: 04/17/2024 10:14 AM MRN: [id number] Endoscopist: Inocente Hausen , MD, [id number] Age: 68 Referring MD:  Date of Birth: Feb 10, 1956 Gender: Male Account #: [id number] Procedure:                Colonoscopy Indications:              Screening for colorectal malignant neoplasm, Last                            colonoscopy: 2018 Medicines:                Monitored Anesthesia Care Procedure:                Pre-Anesthesia Assessment:                           - Prior to the procedure, a History and Physical                            was performed, and patient medications and                            allergies were reviewed. The patient's tolerance of                            previous anesthesia was also reviewed. The risks                            and benefits of the procedure and the sedation                            options and risks were discussed with the patient.                            All questions were answered, and informed consent                            was obtained. Prior Anticoagulants: The patient has                            taken no anticoagulant or antiplatelet agents. ASA                            Grade Assessment: II - A patient with mild systemic                            disease. After reviewing the risks and benefits,                            the patient was deemed in satisfactory condition to                            undergo the procedure.  After obtaining informed consent, the colonoscope                            was passed under direct vision. Throughout the                            procedure, the patient's blood pressure, pulse, and                            oxygen saturations were monitored continuously. The                            Olympus CF-HQ190L ([id number]) Colonoscope was                            introduced through the anus and advanced to  the                            cecum, identified by appendiceal orifice and                            ileocecal valve. The colonoscopy was performed                            without difficulty. The patient tolerated the                            procedure well. The quality of the bowel                            preparation was good. The ileocecal valve,                            appendiceal orifice, and rectum were photographed. Scope In: 10:33:25 AM Scope Out: 10:49:23 AM Scope Withdrawal Time: 0 hours 12 minutes 47 seconds  Total Procedure Duration: 0 hours 15 minutes 58 seconds  Findings:                 The perianal and digital rectal examinations were                            normal. Pertinent negatives include normal                            sphincter tone and no palpable rectal lesions.                           Three sessile polyps were found in the sigmoid                            colon, ascending colon and cecum. The polyps were 3                            to 4 mm in size. These polyps were removed with a  cold biopsy forceps. Resection and retrieval were                            complete.                           Internal hemorrhoids were found during retroflexion. Complications:            No immediate complications. Estimated blood loss:                            Minimal. Estimated Blood Loss:     Estimated blood loss was minimal. Impression:               - Three 3 to 4 mm polyps in the sigmoid colon, in                            the ascending colon and in the cecum, removed with                            a cold biopsy forceps. Resected and retrieved.                           - Internal hemorrhoids. Recommendation:           - Discharge patient to home (ambulatory).                           - Await pathology results.                           - Repeat colonoscopy for surveillance based on                            pathology  results.                           - The findings and recommendations were discussed                            with the patient's family.                           - Patient has a contact number available for                            emergencies. The signs and symptoms of potential                            delayed complications were discussed with the                            patient. Return to normal activities tomorrow.                            Written discharge instructions were provided to the  patient. Inocente Hausen, MD 04/17/2024 10:53:08 AM This report has been signed electronically. Addendum Number: 1   Addendum Date: 04/17/2024 10:53:56 AM      Patient's last colonoscopy from [place]  in 2018 was reviewed. The polyps       removed were a hyperplastic polyp and lymphoid aggregate. No adenomas       were identified on that procedure. Inocente Hausen, MD 04/17/2024 10:54:22 AM This report has been signed electronically.

## 2024-04-17 NOTE — Patient Instructions (Signed)
 Awaiting pathology results. Repeat colonoscopy for surveillance based on pathology results.   YOU HAD AN ENDOSCOPIC PROCEDURE TODAY AT THE [facility]:   Refer to the procedure report that was given to you for any specific questions about what was found during the examination.  If the procedure report does not answer your questions, please call your gastroenterologist to clarify.  If you requested that your care partner not be given the details of your procedure findings, then the procedure report has been included in a sealed envelope for you to review at your convenience later.  YOU SHOULD EXPECT: Some feelings of bloating in the abdomen. Passage of more gas than usual.  Walking can help get rid of the air that was put into your GI tract during the procedure and reduce the bloating. If you had a lower endoscopy (such as a colonoscopy or flexible sigmoidoscopy) you may notice spotting of blood in your stool or on the toilet paper. If you underwent a bowel prep for your procedure, you may not have a normal bowel movement for a few days.  Please Note:  You might notice some irritation and congestion in your nose or some drainage.  This is from the oxygen used during your procedure.  There is no need for concern and it should clear up in a day or so.  SYMPTOMS TO REPORT IMMEDIATELY:  Following lower endoscopy (colonoscopy or flexible sigmoidoscopy):  Excessive amounts of blood in the stool  Significant tenderness or worsening of abdominal pains  Swelling of the abdomen that is new, acute  Fever of 100F or higher  For urgent or emergent issues, a gastroenterologist can be reached at any hour by calling [phone]. Do not use MyChart messaging for urgent concerns.    DIET:  We do recommend a small meal at first, but then you may proceed to your regular diet.  Drink plenty of fluids but you should avoid alcoholic beverages for 24 hours.  ACTIVITY:  You should plan to take it easy  for the rest of today and you should NOT DRIVE or use heavy machinery until tomorrow (because of the sedation medicines used during the test).    FOLLOW UP: Our staff will call the number listed on your records the next business day following your procedure.  We will call around 7:15- 8:00 am to check on you and address any questions or concerns that you may have regarding the information given to you following your procedure. If we do not reach you, we will leave a message.     If any biopsies were taken you will be contacted by phone or by letter within the next 1-3 weeks.  Please call us  at [phone] if you have not heard about the biopsies in 3 weeks.    SIGNATURES/CONFIDENTIALITY: You and/or your care partner have signed paperwork which will be entered into your electronic medical record.  These signatures attest to the fact that that the information above on your After Visit Summary has been reviewed and is understood.  Full responsibility of the confidentiality of this discharge information lies with you and/or your care-partner.

## 2024-04-20 ENCOUNTER — Telehealth: Payer: Self-pay | Admitting: Lactation Services

## 2024-04-20 ENCOUNTER — Encounter: Payer: Self-pay | Admitting: Radiology

## 2024-04-20 NOTE — Telephone Encounter (Signed)
  Follow up Call-     04/17/2024    9:54 AM  Call back number  Post procedure Call Back phone  # [phone]  Permission to leave phone message Yes     Patient questions:  Do you have a fever, pain , or abdominal swelling? No. Pain Score  0 *  Have you tolerated food without any problems? Yes.    Have you been able to return to your normal activities? Yes.    Do you have any questions about your discharge instructions: Diet   No. Medications  No. Follow up visit  No.  Do you have questions or concerns about your Care? No.  Actions: * If pain score is 4 or above: No action needed, pain <4.

## 2024-04-22 ENCOUNTER — Ambulatory Visit: Payer: Self-pay | Admitting: Pediatrics

## 2024-04-22 LAB — SURGICAL PATHOLOGY

## 2024-04-28 ENCOUNTER — Ambulatory Visit (INDEPENDENT_AMBULATORY_CARE_PROVIDER_SITE_OTHER): Admitting: Physician Assistant

## 2024-04-28 ENCOUNTER — Encounter: Payer: Self-pay | Admitting: Physician Assistant

## 2024-04-28 VITALS — BP 138/84

## 2024-04-28 DIAGNOSIS — Z7189 Other specified counseling: Secondary | ICD-10-CM | POA: Diagnosis not present

## 2024-04-28 DIAGNOSIS — B079 Viral wart, unspecified: Secondary | ICD-10-CM

## 2024-04-28 NOTE — Progress Notes (Signed)
   New Patient Visit   Subjective  Nathaniel Stevens is a 68 y.o. male NEW PATIENT who presents for the following: Warts of left hand that have been there about a year. He had them treated with LN2 by his PCP and he has tried Compound W. They stayed the same after the treatments.    The following portions of the chart were reviewed this encounter and updated as appropriate: medications, allergies, medical history  Review of Systems:  No other skin or systemic complaints except as noted in HPI or Assessment and Plan.  Objective  Well appearing patient in no apparent distress; mood and affect are within normal limits.   A focused examination was performed of the following areas: Left hand  Relevant exam findings are noted in the Assessment and Plan.  Left Hand - Anterior (4) Verrucous papules   Assessment & Plan   WARTS Exam: verrucous papule(s)  Counseling Discussed viral / HPV (Human Papilloma Virus) etiology and risk of spread /infectivity to other areas of body as well as to other people.  Multiple treatments and methods may be required to clear warts and it is possible treatment may not be successful.  Treatment risks include discoloration; scarring and there is still potential for wart recurrence.  Treatment Plan: - Liquid nitrogen as below      VIRAL WARTS, UNSPECIFIED TYPE (4) Left Hand - Anterior (4) If he does not respond to LN2 treatment, we can consider a compounded medication from [facility]. Destruction of lesion - Left Hand - Anterior (4) Complexity: simple   Destruction method: cryotherapy   Informed consent: discussed and consent obtained   Timeout:  patient name, date of birth, surgical site, and procedure verified Lesion destroyed using liquid nitrogen: Yes   Region frozen until ice ball extended beyond lesion: Yes   Outcome: patient tolerated procedure well with no complications   Post-procedure details: wound care instructions given     Return  if symptoms worsen or fail to improve.  I, Nathaniel Stevens, CMA, am acting as scribe for Amador Braddy K, PA-C .   Documentation: I have reviewed the above documentation for accuracy and completeness, and I agree with the above.  Nathaniel Stevens K, PA-C

## 2024-04-28 NOTE — Patient Instructions (Addendum)

## 2024-05-26 ENCOUNTER — Encounter: Payer: Self-pay | Admitting: Family Medicine

## 2024-05-26 ENCOUNTER — Other Ambulatory Visit: Payer: Self-pay

## 2024-05-26 ENCOUNTER — Ambulatory Visit (INDEPENDENT_AMBULATORY_CARE_PROVIDER_SITE_OTHER): Admitting: Family Medicine

## 2024-05-26 VITALS — BP 120/86 | Ht 70.0 in | Wt 190.0 lb

## 2024-05-26 DIAGNOSIS — G8929 Other chronic pain: Secondary | ICD-10-CM

## 2024-05-26 DIAGNOSIS — M79671 Pain in right foot: Secondary | ICD-10-CM

## 2024-05-26 DIAGNOSIS — M25571 Pain in right ankle and joints of right foot: Secondary | ICD-10-CM

## 2024-05-26 MED ORDER — PREDNISONE 10 MG PO TABS: ORAL_TABLET | ORAL | 0 refills | Status: DC

## 2024-05-26 NOTE — Progress Notes (Signed)
 DATE OF VISIT: 05/26/2024        Nathaniel Stevens DOB: [birth year] MRN: [id number]  Discussed the use of AI scribe software for clinical note transcription with the patient, who gave verbal consent to proceed.  History of Present Illness Nathaniel Stevens is a 68 year old male who presents for follow-up of  right foot and ankle pain.  Right foot and ankle pain -Initially evaluated by us  11/12/2023, noted ongoing pain for 9 months at that time - Has since seen us  12/12/2023, 12/17/2023, 03/19/2024 - Persistent pain since initial diagnosis on March 19, 2024, feels like it has been acutely worsening -Feeling pain along the medial ankle and into the medial foot along the arch, sometimes described as feeling like 'somebody stepped on the top of the foot.' - Pain exacerbated by activities such as swimming, resulting in difficulty walking the next day. - Occasional improvement after two to three days of rest. - Significant limp when walking in regular shoes with orthotics. - Pain sometimes described as feeling like a joint is 'broken.' - No recent swelling or bruising. - No numbness or tingling related to the current issue.  Previous imaging and diagnostic findings - Musculoskeletal ultrasound on March 19, 2024 showed posterior tibialis tendinopathy without tearing and midfoot degenerative changes. - Ankle and foot x-rays in May demonstrated degenerative changes.  Therapeutic interventions and response - Orthotic adjusted with a first ray post on the right side at last visit - Continues to use meloxicam  with ongoing pain. - Used Voltaren gel consistently but discontinued one week ago due to lack of improvement. - Wrapping the foot with Ace wrap provided some relief; stronger tape was less effective.  Additional foot symptoms and history - Big toe soreness since kicking a metal spike two years ago. - History of sciatica two years ago resulting in persistent numbness in four toes.    Medications:   Outpatient Encounter Medications as of 05/26/2024  Medication Sig   predniSONE  (DELTASONE ) 10 MG tablet Use as directed per doctors orders for the next 6 days.   Ferrous Sulfate (IRON PO) Take 1 tablet by mouth. Taking 2-3 times a week   meloxicam  (MOBIC ) 15 MG tablet Take 1 tablet (15 mg total) by mouth daily as needed for pain.   Multiple Vitamin (MULTIVITAMIN WITH MINERALS) TABS tablet Take 1 tablet by mouth. Takes 2-3 times a week   No facility-administered encounter medications on file as of 05/26/2024.    Allergies: has no known allergies.  Physical Examination: Vitals: BP 120/86   Ht 5' 10 (1.778 m)   Wt 190 lb (86.2 kg)   BMI 27.26 kg/m  GENERAL:  Tirrell Buchberger is a 68 y.o. male appearing their stated age, alert and oriented x 3, in no apparent distress.  SKIN: no rashes or lesions, skin clean, dry, intact MSK: Foot/ankle: Right foot and ankle with mild soft tissue swelling along the medial ankle.  No increased redness or warmth.  Tender to palpation along the navicular, as well as over the medial ankle joint.  Minimal tenderness over the posterior tibialis.  Full range of motion of the ankle with mild pain at terminal plantarflexion.  Ankle strength 5/5.  No bony tenderness over the medial or lateral malleolus.  No other bony tenderness throughout the midfoot or the forefoot. Walking with a limp Neurovascular intact distally  Radiology: Limited MSK ultrasound right foot/ankle Date: 05/26/2024 Indication: Ongoing right foot/ankle pain Findings: - Posterior tibialis with thickening, slight hypoechoic change noted distal to the  medial malleolus, no significant edema within the tendon sheath.  No increased Doppler flow.  No signs of acute tearing.   - Normal-appearing flexor digitorum and flexor hallucis.  Normal-appearing neurovascular bundle along the medial ankle - Has mild talonavicular degenerative changes, no effusion - Mild degenerative changes at the navicular cuneiform  articulation, no effusion   Impression: - Again noted to have posterior tibialis tendinopathy without any signs of tearing - Again noted to have mild midfoot degenerative changes, specifically around the navicular Images and interpretation completed by Rainell Cedar, DO    Limited MSK ultrasound right foot/ankle Date: 03/19/2024 Indication: Right foot/ankle pain Findings: - Posterior tibialis with thickening, slight hypoechoic change, no significant edema within the tendon sheath.  No increased Doppler flow.  No signs of tearing.  Well-visualized in short and long axis views, following distally to insertion - Normal-appearing flexor digitorum and flexor hallucis.  Normal-appearing neurovascular bundle along the medial ankle - Has mild tarsonavicular degenerative changes, no effusion - Mild degenerative changes at the navicular cuneiform articulation, no effusion - First TMT joint with no significant effusion   Impression: - Posterior tibialis tendinopathy without any signs of tearing - Mild midfoot degenerative changes, specifically around the navicular Images and interpretation completed by Rainell Cedar, DO   Right ankle x-ray 3 views AP/lateral/mortise 11/12/2023 showing: -No acute fracture - Small plantar calcaneal heel spur is noted -Does have cortical irregularity along the distal fibula along the syndesmosis, no signs of acute fracture.  Likely related to prior injury/trauma   Right foot x-ray 3 views AP/lateral/oblique 11/12/2023 showing: - First MTP joint with severe degenerative changes, associated peripheral osteophytes noted. - Prominence of the medial navicular, no bony irregularities.  No signs of os naviculare. - Scattered vascular calcifications noted  Assessment & Plan Ongoing acute on chronic right foot pain with osteoarthritis and posterior tibialis tendinopathy Pain persists despite non-impact activities and 6+ weeks of NSAIDs, custom orthotics, 6+ weeks of home  exercise program - Limited MSK ultrasound today showing no significant changes from previous in October.  MRI needed to evaluate for potential bony abnormality or other soft tissue abnormality.  Concern for possible bony stress injury given ongoing symptoms. Ordered MRI of the right ankle. - Fitted for a cam boot boot for support and stability. - Prescribed six-day course of oral steroids. - Continue using orthotics within the cam boot. - Scheduled follow-up one week post-MRI to review results and adjust treatment.     Patient expressed understanding & agreement with above.  Encounter Diagnosis  Name Primary?   Chronic pain of right ankle Yes    Orders Placed This Encounter  Procedures   US  LIMITED JOINT SPACE STRUCTURES LOW RIGHT   MR ANKLE RIGHT WO CONTRAST     Contains text generated by [employer].

## 2024-06-03 ENCOUNTER — Inpatient Hospital Stay: Admission: RE | Admit: 2024-06-03 | Discharge: 2024-06-03 | Attending: Family Medicine | Admitting: Family Medicine

## 2024-06-03 DIAGNOSIS — G8929 Other chronic pain: Secondary | ICD-10-CM

## 2024-06-04 ENCOUNTER — Ambulatory Visit: Payer: Self-pay | Admitting: Family Medicine

## 2024-07-14 ENCOUNTER — Ambulatory Visit: Admitting: Physician Assistant

## 2024-07-14 ENCOUNTER — Encounter: Payer: Self-pay | Admitting: Physician Assistant

## 2024-07-14 VITALS — BP 120/72 | HR 59 | Temp 97.3°F | Ht 70.0 in | Wt 193.0 lb

## 2024-07-14 DIAGNOSIS — G8929 Other chronic pain: Secondary | ICD-10-CM

## 2024-07-14 DIAGNOSIS — Z0000 Encounter for general adult medical examination without abnormal findings: Secondary | ICD-10-CM

## 2024-07-14 DIAGNOSIS — Z23 Encounter for immunization: Secondary | ICD-10-CM

## 2024-07-14 DIAGNOSIS — N401 Enlarged prostate with lower urinary tract symptoms: Secondary | ICD-10-CM

## 2024-07-14 DIAGNOSIS — Z125 Encounter for screening for malignant neoplasm of prostate: Secondary | ICD-10-CM | POA: Diagnosis not present

## 2024-07-14 DIAGNOSIS — N3943 Post-void dribbling: Secondary | ICD-10-CM

## 2024-07-14 DIAGNOSIS — Z13220 Encounter for screening for lipoid disorders: Secondary | ICD-10-CM | POA: Diagnosis not present

## 2024-07-14 DIAGNOSIS — M25571 Pain in right ankle and joints of right foot: Secondary | ICD-10-CM

## 2024-07-14 LAB — LIPID PANEL
Cholesterol: 204 mg/dL — ABNORMAL HIGH (ref 28–200)
HDL: 35.2 mg/dL — ABNORMAL LOW
LDL Cholesterol: 116 mg/dL — ABNORMAL HIGH (ref 10–99)
NonHDL: 168.64
Total CHOL/HDL Ratio: 6
Triglycerides: 261 mg/dL — ABNORMAL HIGH (ref 10.0–149.0)
VLDL: 52.2 mg/dL — ABNORMAL HIGH (ref 0.0–40.0)

## 2024-07-14 LAB — COMPREHENSIVE METABOLIC PANEL WITH GFR
ALT: 15 U/L (ref 3–53)
AST: 18 U/L (ref 5–37)
Albumin: 4.5 g/dL (ref 3.5–5.2)
Alkaline Phosphatase: 48 U/L (ref 39–117)
BUN: 12 mg/dL (ref 6–23)
CO2: 28 meq/L (ref 19–32)
Calcium: 9.3 mg/dL (ref 8.4–10.5)
Chloride: 104 meq/L (ref 96–112)
Creatinine, Ser: 0.87 mg/dL (ref 0.40–1.50)
GFR: 89.27 mL/min
Glucose, Bld: 84 mg/dL (ref 70–99)
Potassium: 4.4 meq/L (ref 3.5–5.1)
Sodium: 140 meq/L (ref 135–145)
Total Bilirubin: 0.6 mg/dL (ref 0.2–1.2)
Total Protein: 6.8 g/dL (ref 6.0–8.3)

## 2024-07-14 LAB — PSA: PSA: 0.91 ng/mL (ref 0.10–4.00)

## 2024-07-14 LAB — CBC WITH DIFFERENTIAL/PLATELET
Basophils Absolute: 0 10*3/uL (ref 0.0–0.1)
Basophils Relative: 0.7 % (ref 0.0–3.0)
Eosinophils Absolute: 0.1 10*3/uL (ref 0.0–0.7)
Eosinophils Relative: 3.2 % (ref 0.0–5.0)
HCT: 45.6 % (ref 39.0–52.0)
Hemoglobin: 15.6 g/dL (ref 13.0–17.0)
Lymphocytes Relative: 25 % (ref 12.0–46.0)
Lymphs Abs: 1.2 10*3/uL (ref 0.7–4.0)
MCHC: 34.2 g/dL (ref 30.0–36.0)
MCV: 91 fl (ref 78.0–100.0)
Monocytes Absolute: 0.4 10*3/uL (ref 0.1–1.0)
Monocytes Relative: 7.9 % (ref 3.0–12.0)
Neutro Abs: 3 10*3/uL (ref 1.4–7.7)
Neutrophils Relative %: 63.2 % (ref 43.0–77.0)
Platelets: 279 10*3/uL (ref 150.0–400.0)
RBC: 5.02 Mil/uL (ref 4.22–5.81)
RDW: 12.6 % (ref 11.5–15.5)
WBC: 4.7 10*3/uL (ref 4.0–10.5)

## 2024-07-14 NOTE — Progress Notes (Addendum)
 "  Subjective:    Glendel Jaggers is a 69 y.o. male and is here for a comprehensive physical exam.  HPI  Health Maintenance Due  Topic Date Due   COVID-19 Vaccine (4 - 2025-26 season) 02/17/2024   DTaP/Tdap/Td (2 - Td or Tdap) 05/24/2024   Discussed the use of AI scribe software for clinical note transcription with the patient, who gave verbal consent to proceed.  History of Present Illness   Nellie Chevalier is a 69 year old male who presents for Comprehensive Physical Exam (CPE) preventive care annual visit with chronic right ankle pain.  He has had right ankle pain for over a year without clear injury. Pain limits him to less than a mile of walking and led him to stop walking for exercise. Swimming also caused pain but this has recently improved. He used orthotics for many years for plantar fasciitis, briefly stopped them with short-term relief, but pain returned within about a week. He was given a boot without guidance on duration of use or physical therapy. He has used meloxicam  in the past but stopped due to concern about taking medications. Prior x-ray, ultrasound, and MRI showed soft tissue inflammation only.  He has difficulty ending urination. His PSA levels have been stable and there is no family history of prostate cancer.  He reports a generally healthy diet and has reduced alcohol intake as part of dry January. He is up to date with colonoscopy, dermatology, dental, and eye exams.      Health Maintenance: Immunizations -- updated Tetanus, Diphtheria, and Pertussis (Tdap) today Colonoscopy -- UpToDate  PSA --  Lab Results  Component Value Date   PSA 1.00 07/09/2023   PSA 0.95 06/07/2022   PSA 0.89 05/02/2021   Diet -- overall healthy Sleep habits -- reports there is no concerns Exercise -- as able given ankle issues  Weight -- Weight: 193 lb (87.5 kg)  Recent weight history Wt Readings from Last 10 Encounters:  07/14/24 193 lb (87.5 kg)  05/26/24 190 lb (86.2  kg)  04/17/24 186 lb (84.4 kg)  04/09/24 186 lb (84.4 kg)  03/19/24 190 lb (86.2 kg)  12/17/23 185 lb (83.9 kg)  12/12/23 185 lb (83.9 kg)  11/12/23 185 lb (83.9 kg)  11/07/23 191 lb 6.1 oz (86.8 kg)  10/14/23 192 lb 6.1 oz (87.3 kg)   Body mass index is 27.69 kg/m.  Mood -- stable Alcohol use --  reports current alcohol use of about 4.0 standard drinks of alcohol per week.  Tobacco use --  Tobacco Use: Medium Risk (07/14/2024)   Patient History    Smoking Tobacco Use: Former    Smokeless Tobacco Use: Never    Passive Exposure: Not on file    Eligible for Low Dose CT? no  UTD with eye doctor? yes UTD with dentist? yes     07/14/2024    1:48 PM  Depression screen PHQ 2/9  Decreased Interest 0  Down, Depressed, Hopeless 0  PHQ - 2 Score 0    Other providers/specialists: Patient Care Team: Job Lukes, [place] as PCP - General (Physician Assistant)    PMHx, SurgHx, SocialHx, Medications, and Allergies were reviewed in the Visit Navigator and updated as appropriate.   Past Medical History:  Diagnosis Date   Arthritis 2014   Blood transfusion without reported diagnosis    Heart murmur 1958   Hepatitis B    due to transfusion   Hepatitis C    due to transfusion   History  of chicken pox    Hyperlipidemia      Past Surgical History:  Procedure Laterality Date   COLONOSCOPY     EYE SURGERY  1999   JOINT REPLACEMENT Right 2012   Rt Knee   KIDNEY SURGERY Left 1981   SHOULDER SURGERY Right 1999   wrist and hand surgery Right 2002     Family History  Problem Relation Age of Onset   Melanoma Mother    Hypertension Father    Hyperlipidemia Father    Other Father        Exposed to Agent Orange   Cirrhosis Father    Heart attack Sister 22   Dementia Maternal Grandmother    CVA Maternal Grandmother    Cancer Neg Hx    Colon cancer Neg Hx    Esophageal cancer Neg Hx    Rectal cancer Neg Hx    Stomach cancer Neg Hx     Social History[1]  Review of  Systems:   Review of Systems  Constitutional:  Negative for chills, fever, malaise/fatigue and weight loss.  HENT:  Negative for hearing loss, sinus pain and sore throat.   Respiratory:  Negative for cough and hemoptysis.   Cardiovascular:  Negative for chest pain, palpitations, leg swelling and PND.  Gastrointestinal:  Negative for abdominal pain, constipation, diarrhea, heartburn, nausea and vomiting.  Genitourinary:  Negative for dysuria, frequency and urgency.  Musculoskeletal:  Positive for joint pain. Negative for back pain, myalgias and neck pain.  Skin:  Negative for itching and rash.  Neurological:  Negative for dizziness, tingling, seizures and headaches.  Endo/Heme/Allergies:  Negative for polydipsia.  Psychiatric/Behavioral:  Negative for depression. The patient is not nervous/anxious.     Objective:    Vitals:   07/14/24 1351  BP: 120/72  Pulse: (!) 59  Temp: (!) 97.3 F (36.3 C)  SpO2: 98%    Body mass index is 27.69 kg/m.  General  Alert, cooperative, no distress, appears stated age  Head:  Normocephalic, without obvious abnormality, atraumatic  Eyes:  PERRL, conjunctiva/corneas clear, EOM's intact, fundi benign, both eyes       Ears:  Normal TM's and external ear canals, both ears  Nose: Nares normal, septum midline, mucosa normal, no drainage or sinus tenderness  Throat: Lips, mucosa, and tongue normal; teeth and gums normal  Neck: Supple, symmetrical, trachea midline, no adenopathy;     thyroid:  No enlargement/tenderness/nodules; no carotid bruit or JVD  Back:   Symmetric, no curvature, ROM normal, no CVA tenderness  Lungs:   Clear to auscultation bilaterally, respirations unlabored  Chest wall:  No tenderness or deformity  Heart:  Regular rate and rhythm, S1 and S2 normal, no murmur, rub or gallop  Abdomen:   Soft, non-tender, bowel sounds active all four quadrants, no masses, no organomegaly  Extremities: Extremities normal, atraumatic, no cyanosis or  edema  Prostate : Deferred   Skin: Skin color, texture, turgor normal, no rashes or lesions  Lymph nodes: Cervical, supraclavicular, and axillary nodes normal  Neurologic: CNII-XII grossly intact. Normal strength, sensation and reflexes throughout   AssessmentPlan:   Assessment and Plan    General health maintenance Up to date with colonoscopy, dermatology, dental, and eye exams. Healthy diet with moderation in alcohol and snacks.     Chronic right ankle pain Persistent pain over a year, worsens with walking, imaging showed soft tissue inflammation, orthotics and meloxicam  ineffective. - Referred to physical therapy for assessment and management.  Benign prostatic hyperplasia with  lower urinary tract symptoms; Prostate cancer screening Stable PSA levels, symptoms consistent with age-related prostate enlargement. - Checked PSA level today.     Lucie Buttner, PA-C [facility]      [1]  Social History Tobacco Use   Smoking status: Former    Current packs/day: 0.00    Types: Cigarettes    Quit date: 08/03/1977    Years since quitting: 46.9   Smokeless tobacco: Never   Tobacco comments:    as a teen, has not smoked in 40 yrs  Vaping Use   Vaping status: Never Used  Substance Use Topics   Alcohol use: Yes    Alcohol/week: 4.0 standard drinks of alcohol    Types: 4 Glasses of wine per week    Comment: occasionally   Drug use: Never   "

## 2024-07-15 ENCOUNTER — Ambulatory Visit: Payer: Self-pay | Admitting: Physician Assistant

## 2024-07-22 ENCOUNTER — Encounter: Payer: Self-pay | Admitting: Physical Therapy

## 2024-07-22 ENCOUNTER — Ambulatory Visit (INDEPENDENT_AMBULATORY_CARE_PROVIDER_SITE_OTHER): Admitting: Physical Therapy

## 2024-07-22 DIAGNOSIS — R2689 Other abnormalities of gait and mobility: Secondary | ICD-10-CM

## 2024-07-22 DIAGNOSIS — M25571 Pain in right ankle and joints of right foot: Secondary | ICD-10-CM | POA: Diagnosis not present

## 2024-07-22 NOTE — Therapy (Signed)
 " OUTPATIENT PHYSICAL THERAPY LOWER EXTREMITY EVALUATION   Patient Name: Nathaniel Stevens MRN: [id number] DOB:09-13-1955, 69 y.o., male Today's Date: 07/22/2024  END OF SESSION:  PT End of Session - 07/22/24 1047     Visit Number 1    Number of Visits 16    Date for Recertification  09/16/24    Authorization Type [employer]    PT Start Time [phone]    PT Stop Time 0930    PT Time Calculation (min) 40 min    Activity Tolerance Patient tolerated treatment well    Behavior During Therapy WFL for tasks assessed/performed          Past Medical History:  Diagnosis Date   Arthritis 2014   Blood transfusion without reported diagnosis    Heart murmur 1958   Hepatitis B    due to transfusion   Hepatitis C    due to transfusion   History of chicken pox    Hyperlipidemia    Past Surgical History:  Procedure Laterality Date   COLONOSCOPY     EYE SURGERY  1999   JOINT REPLACEMENT Right 2012   Rt Knee   KIDNEY SURGERY Left 1981   SHOULDER SURGERY Right 1999   wrist and hand surgery Right 2002   Patient Active Problem List   Diagnosis Date Noted   History of hepatitis B 08/03/2020     PCP: Lucie Buttner  REFERRING PROVIDER: Lucie Buttner  REFERRING DIAG: R foot/ankle pain  THERAPY DIAG:  Pain in right ankle and joints of right foot  Other abnormalities of gait and mobility  Rationale for Evaluation and Treatment: Rehabilitation  ONSET DATE: 2 yrs ago.     SUBJECTIVE:   SUBJECTIVE STATEMENT: Pt states ongoing pain in R ankle for a couple years. He used to have stiff orthotics that he wore for 25 + yrs,  now wearing new ones made by Dr.  JONELLE arch pain now better with new ones. But ankle still sore  Also wore Boot for about  8 weeks. Foot feels good in the boot, but painful when not in it.  Increased pain: standing, walking.  Exercise:   tires for 6 d/wk, swims, bikes, elliptical at home. - pain with most of these Pt does not have to stand a lot at work, but usually is  Limping by at end of day.   PERTINENT HISTORY:n/a   PAIN:  Are you having pain? Yes: NPRS scale:   6 /10 Pain location: R medial ankle.  Pain description: achey  Aggravating factors: standing, walking  Relieving factors: rest, sitting  PRECAUTIONS: None  WEIGHT BEARING RESTRICTIONS: No  FALLS:  Has patient fallen in last 6 months? No   PLOF: Independent  PATIENT GOALS:  Decreased pain in R ankle   NEXT MD VISIT:  does not have MD f/u .  OBJECTIVE:   DIAGNOSTIC FINDINGS:  recent MRI and x-ray   PATIENT SURVEYS:    COGNITION: Overall cognitive status: Within functional limits for tasks assessed     SENSATION: WFL  EDEMA: n/a   POSTURE:  standing: L foot: low arch height,   R:  very low arch height with increased navicular drop.   PALPATION: Tenderness in R  MTP,   Pain and tenderness in R navicular , ant/med ankle, no pain in malleolus,  No pain in achilles, pl fascia, or peroneals (all with + findings on Mri )   LOWER EXTREMITY ROM: WFL  LOWER EXTREMITY MMT:  MMT Left eval Right  eval  Hip flexion    Hip extension    Hip abduction    Hip adduction    Hip internal rotation    Hip external rotation    Knee flexion    Knee extension    Ankle dorsiflexion 4 4  Ankle plantarflexion 3 3  Ankle inversion 4 4  Ankle eversion 4 4   (Blank rows = not tested)  LOWER EXTREMITY SPECIAL TESTS:    FUNCTIONAL TESTS:  Challenging full motion heel raise, mild pain in R gr toe.   GAIT: Distance walked: 300 ft Assistive device utilized: None Level of assistance: Complete Independence Comments: tendency for mild toeing out on R- possibly due to great toe pain.  Decreased push off on R- pain   TODAY'S TREATMENT:                                                                                                                              DATE:  Eval: ther ex: see below for HEP Pt will bring shoes next visit.  Orthotic and footwear  exam/discussion   PATIENT EDUCATION:  Education details: PT POC, Exam findings, HEP Person educated: Patient Education method: Explanation, Demonstration, Tactile cues, Verbal cues, and Handouts Education comprehension: verbalized understanding, returned demonstration, verbal cues required, tactile cues required, and needs further education   HOME EXERCISE PROGRAM: Access Code: [email or web address] URL: [email or web address] Date: 07/22/2024 Prepared by: Tinnie Don  Exercises - Heel Raises with Counter Support  - 1 x daily - 5 x weekly - 1 sets - 10 reps - Ankle and Toe Plantarflexion with Resistance  - 1 x daily - 1-2 sets - 10 reps - Great Toe Flexion with Resistance  - 1 x daily - 1 sets - 10 reps  ASSESSMENT:  CLINICAL IMPRESSION: Patient presents with primary complaint of  pain in R foot and ankle. He has most pain at medial foot as well as in R great toe. He has had ongoing pain with little relief. He has newer orthotics that he is wearing. We discussed footwear today, and pt will bring in other shoes at next visit to assess. He has pain likely stemming from over pronation of R foot, and will benefit from optimal support for this. He also has decreased push off with R foot, due to ongoing pain and OA in R great toe. He has most pain and difficulty with longer duration standing and walking. Pt with decreased ability for full functional activities. Pt will  benefit from skilled PT to improve deficits and pain and to return to PLOF.   OBJECTIVE IMPAIRMENTS: Abnormal gait, decreased activity tolerance, decreased mobility, decreased strength, and pain.   ACTIVITY LIMITATIONS: standing, squatting, stairs, and locomotion level  PARTICIPATION LIMITATIONS: cleaning, shopping, community activity, occupation, and yard work  PERSONAL FACTORS: Time since onset of injury/illness/exacerbation are also affecting patient's functional outcome.   REHAB POTENTIAL: Good  CLINICAL DECISION  MAKING: Evolving/moderate complexity  EVALUATION COMPLEXITY: Moderate  GOALS: Goals reviewed with patient? Yes  SHORT TERM GOALS: Target date: 08/05/2024  Pt to be independent with initial HEP  Goal status: INITIAL  2.  Pt to demo ability to do at least 10 reps of pain free heel raise.   Goal status: INITIAL    LONG TERM GOALS: Target date: 09/16/2024  Pt to be independent with final HEP  Goal status: INITIAL  2.  Pt to be compliant with footwear and orthotics appropriate for his foot type, for offloading foot and pain relief.   Goal status: INITIAL  3.  Pt to demo ability for ambulation for at least 30 min, with pain 0-2/10 in foot/ankle.   Goal status: INITIAL  4.  Pt to report decreased pain in R ankle to 0-2/10 with activity.   Goal status: INITIAL  5.  Pt to demo improved strength of foot/ankle to at least 4+/5, to improve stability and pain.   Goal status: INITIAL    PLAN:  PT FREQUENCY: 1-2x/week  PT DURATION: 8 weeks  PLANNED INTERVENTIONS: Therapeutic exercises, Therapeutic activity, Neuromuscular re-education, Patient/Family education, Self Care, Joint mobilization, Joint manipulation, Stair training, Orthotic/Fit training, DME instructions, Aquatic Therapy, Dry Needling, Electrical stimulation, Cryotherapy, Moist heat, Taping, Ultrasound, Ionotophoresis 4mg /ml Dexamethasone, Manual therapy,  Vasopneumatic device, Traction, Spinal manipulation, Spinal mobilization,Balance training, Gait training,   PLAN FOR NEXT SESSION:    Tinnie Don, PT, DPT 11:01 AM  07/22/24   "

## 2024-07-27 ENCOUNTER — Encounter: Admitting: Physical Therapy

## 2024-08-05 ENCOUNTER — Encounter: Admitting: Physical Therapy

## 2024-08-10 ENCOUNTER — Encounter: Admitting: Physical Therapy

## 2024-08-17 ENCOUNTER — Encounter: Admitting: Physical Therapy
